# Patient Record
Sex: Male | Born: 1975
Health system: Southern US, Community
[De-identification: ages and names within clinical notes are randomized; demographics above are authoritative.]

## PROBLEM LIST (undated history)

## (undated) DIAGNOSIS — T7840XA Allergy, unspecified, initial encounter: Secondary | ICD-10-CM

## (undated) HISTORY — PX: WISDOM TOOTH EXTRACTION: SHX21

## (undated) HISTORY — DX: Allergy, unspecified, initial encounter: T78.40XA

## (undated) HISTORY — PX: VASECTOMY REVERSAL: SHX243

## (undated) HISTORY — PX: DEBRIDEMENT PREPATELLAR BURSA: SHX1441

## (undated) HISTORY — PX: VASECTOMY: SHX75

---

## 2012-02-14 ENCOUNTER — Ambulatory Visit (INDEPENDENT_AMBULATORY_CARE_PROVIDER_SITE_OTHER): Payer: BC Managed Care – PPO | Admitting: Family Medicine

## 2012-02-14 VITALS — BP 108/74 | HR 86 | Temp 98.1°F | Resp 16 | Ht 71.0 in | Wt 187.4 lb

## 2012-02-14 DIAGNOSIS — Z0000 Encounter for general adult medical examination without abnormal findings: Secondary | ICD-10-CM

## 2012-02-14 DIAGNOSIS — J069 Acute upper respiratory infection, unspecified: Secondary | ICD-10-CM

## 2012-02-14 LAB — LIPID PANEL
Cholesterol: 176 mg/dL (ref 0–200)
Triglycerides: 56 mg/dL (ref <150)
VLDL: 11 mg/dL (ref 0–40)

## 2012-02-14 LAB — COMPREHENSIVE METABOLIC PANEL
ALT: 17 U/L (ref 0–53)
CO2: 29 mEq/L (ref 19–32)
Calcium: 9.9 mg/dL (ref 8.4–10.5)
Chloride: 105 mEq/L (ref 96–112)
Glucose, Bld: 84 mg/dL (ref 70–99)
Sodium: 143 mEq/L (ref 135–145)
Total Bilirubin: 0.6 mg/dL (ref 0.3–1.2)
Total Protein: 7 g/dL (ref 6.0–8.3)

## 2012-02-14 NOTE — Progress Notes (Signed)
  Subjective:    Patient ID: Omar Beard, male    DOB: 02-19-1976, 36 y.o.   MRN: [id number]  HPI Patient presents for CPE.  Paperwork required by work for [employer].  SH/ Wife to be induced tonight; this will be patients sixth child.  Review of Systems See blue form(to be scanned)    Objective:   Physical Exam  Constitutional: He appears well-developed and well-nourished.  HENT:  Head: Normocephalic and atraumatic.  Right Ear: External ear normal.  Left Ear: External ear normal.  Nose: Rhinorrhea present.  Mouth/Throat: Posterior oropharyngeal edema: clear post nasal drainage.  Eyes: Conjunctivae and EOM are normal. Pupils are equal, round, and reactive to light.  Neck: Normal range of motion. Neck supple.  Cardiovascular: Normal rate, regular rhythm and normal heart sounds.   Pulmonary/Chest: Effort normal and breath sounds normal.  Abdominal: Soft. Bowel sounds are normal. He exhibits no mass. There is no tenderness.  Musculoskeletal: Normal range of motion.  Neurological: He is alert. He has normal reflexes.  Skin: Skin is warm.  Psychiatric: He has a normal mood and affect.          Assessment & Plan:   1. Routine general medical examination at a health care facility  Comprehensive metabolic panel, Lipid panel; form completion  2. URI (upper respiratory infection)  Symptomatic care

## 2012-02-17 NOTE — Progress Notes (Signed)
Quick Note:  Patient has normal labs except for a slightly elevated potassium(as it does not appear to be a hemolyzed specimen). This should be repeated in the next week. Lab only encounter. Please call patient and send him a copy of his labs. He also had a job PE form that I will fax today. Thanks! KR  ______

## 2012-02-27 ENCOUNTER — Ambulatory Visit (INDEPENDENT_AMBULATORY_CARE_PROVIDER_SITE_OTHER): Payer: BC Managed Care – PPO | Admitting: Internal Medicine

## 2012-02-27 VITALS — BP 110/72 | HR 61 | Temp 97.9°F | Resp 16 | Ht 71.0 in | Wt 186.0 lb

## 2012-02-27 DIAGNOSIS — E875 Hyperkalemia: Secondary | ICD-10-CM | POA: Insufficient documentation

## 2012-02-27 DIAGNOSIS — Z719 Counseling, unspecified: Secondary | ICD-10-CM

## 2012-02-27 LAB — BASIC METABOLIC PANEL
BUN: 12 mg/dL (ref 6–23)
CO2: 27 mEq/L (ref 19–32)
Chloride: 105 mEq/L (ref 96–112)
Creat: 1.03 mg/dL (ref 0.50–1.35)
Glucose, Bld: 85 mg/dL (ref 70–99)
Potassium: 5.3 mEq/L (ref 3.5–5.3)

## 2012-02-27 NOTE — Patient Instructions (Signed)
Hyperkalemia   Hyperkalemia is when you have too much potassium in your blood. This can be a life-threatening condition. Potassium is normally removed (excreted) from the body by the kidneys.   CAUSES   The potassium level in your body can become too high for the following reasons:   You take in too much potassium. You can do this by:   Using salt substitutes. They contain large amounts of potassium.   Taking potassium supplements from your caregiver. The dose may be too high for you.   Eating foods or taking nutritional products with potassium.   You excrete too little potassium. This can happen if:   Your kidneys are not functioning properly. Kidney (renal) disease is a very common cause of hyperkalemia.   You are taking medicines that lower your excretion of potassium, such as certain diuretic medicines.   You have an adrenal gland disease called Addison's disease.   You have a urinary tract obstruction, such as kidney stones.   You are on treatment to mechanically clean your blood (dialysis) and you skip a treatment.   You release a high amount of potassium from your cells into your blood. You may have a condition that causes potassium to move from your cells to your bloodstream. This can happen with:   Injury to muscles or other tissues. Most potassium is stored in the muscles.   Severe burns or infections.   Acidic blood plasma (acidosis). Acidosis can result from many diseases, such as uncontrolled diabetes.   SYMPTOMS   Usually, there are no symptoms unless the potassium is dangerously high or has risen very quickly. Symptoms may include:   Irregular or very slow heartbeat.   Feeling sick to your stomach (nauseous).   Tiredness (fatigue).   Nerve problems such as tingling of the skin, numbness of the hands or feet, weakness, or paralysis.   DIAGNOSIS   A simple blood test can measure the amount of potassium in your body. An electrocardiogram test of the heart can also help make the diagnosis. The heart may  beat dangerously fast or slow down and stop beating with severe hyperkalemia.   TREATMENT   Treatment depends on how bad the condition is and on the underlying cause.   If the hyperkalemia is an emergency (causing heart problems or paralysis), many different medicines can be used alone or together to lower the potassium level briefly. This may include an insulin injection even if you are not diabetic. Emergency dialysis may be needed to remove potassium from the body.   If the hyperkalemia is less severe or dangerous, the underlying cause is treated. This can include taking medicines if needed. Your prescription medicines may be changed. You may also need to take a medicine to help your body get rid of potassium. You may need to eat a diet low in potassium.   HOME CARE INSTRUCTIONS   Take medicines and supplements as directed by your caregiver.   Do not take any over-the-counter medicines, supplements, natural products, herbs, or vitamins without reviewing them with your caregiver. Certain supplements and natural food products can have high amounts of potassium. Other products (such as ibuprofen) can damage weak kidneys and raise your potassium.   You may be asked to do repeat lab tests. Be sure to follow these directions.   If you have kidney disease, you may need to follow a low potassium diet.   SEEK MEDICAL CARE IF:   You notice an irregular or very slow heartbeat.     You feel lightheaded.   You develop weakness that is unusual for you.   SEEK IMMEDIATE MEDICAL CARE IF:   You have shortness of breath.   You have chest discomfort.   You pass out (faint).   MAKE SURE YOU:   Understand these instructions.   Will watch your condition.   Will get help right away if you are not doing well or get worse.   Document Released: 11/29/2002 Document Revised: 11/28/2011 Document Reviewed: 05/16/2011   Exit[employer]® Patient Information ©2012 [employer].

## 2012-02-27 NOTE — Progress Notes (Signed)
  Subjective:    Patient ID: Omar Beard, male    DOB: 06/17/1976, 36 y.o.   MRN: [id number]  HPI Healthy   Review of Systems    Normal Objective:   Physical Exam  Normal      Assessment & Plan:  Hyperkalemia H   Hyperkalemia  Bmet Feels great.

## 2012-03-02 ENCOUNTER — Encounter: Payer: Self-pay | Admitting: *Deleted

## 2017-12-19 ENCOUNTER — Other Ambulatory Visit: Payer: Self-pay | Admitting: Family Medicine

## 2017-12-19 DIAGNOSIS — J3489 Other specified disorders of nose and nasal sinuses: Secondary | ICD-10-CM | POA: Diagnosis not present

## 2017-12-22 ENCOUNTER — Ambulatory Visit
Admission: RE | Admit: 2017-12-22 | Discharge: 2017-12-22 | Disposition: A | Discharge status: Home / Self Care | Payer: BLUE CROSS/BLUE SHIELD | Source: Ambulatory Visit | Attending: Family Medicine | Admitting: Family Medicine

## 2017-12-22 DIAGNOSIS — J3489 Other specified disorders of nose and nasal sinuses: Secondary | ICD-10-CM | POA: Diagnosis not present

## 2018-03-31 LAB — BASIC METABOLIC PANEL: Glucose: 92

## 2018-03-31 LAB — LIPID PANEL
Cholesterol: 149 (ref 0–200)
HDL: 62 (ref 35–70)
LDL CALC: 76
Triglycerides: 55 (ref 40–160)

## 2018-04-14 DIAGNOSIS — J342 Deviated nasal septum: Secondary | ICD-10-CM | POA: Diagnosis not present

## 2018-04-14 DIAGNOSIS — J329 Chronic sinusitis, unspecified: Secondary | ICD-10-CM | POA: Insufficient documentation

## 2018-04-14 DIAGNOSIS — J343 Hypertrophy of nasal turbinates: Secondary | ICD-10-CM | POA: Diagnosis not present

## 2018-11-23 NOTE — Progress Notes (Signed)
Subjective:    Patient ID: Omar Beard, male    DOB: 24-Oct-1976, 42 y.o.   MRN: [id number]  HPI: Omar Beard is here to establish as a new pt. He is a pleasant 42 male. PMH: Bil knee pain r/t repetitive use Recurrent sinusitis  He denies acute complaints currently  He estimates to drink >gallon water/day He follows heart healthy diet He exercises 5 days week- HITT cardio and weight training He abstains from tobacco/vape/ETOH use He reports sound sleep He is married with 9 children and his wife is pregnant with their 10th  Patient Care Team    Relationship Specialty Notifications Start End  [place], Orpha Bur D, NP PCP - General Family Medicine  11/26/18   Flo Shanks, MD Consulting Physician Otolaryngology  11/26/18     Patient Active Problem List   Diagnosis Date Noted  . Healthcare maintenance 11/26/2018  . Hyperkalemia 02/27/2012     History reviewed. No pertinent past medical history.   Past Surgical History:  Procedure Laterality Date  . DEBRIDEMENT PREPATELLAR BURSA    . VASECTOMY    . VASECTOMY REVERSAL    . WISDOM TOOTH EXTRACTION       History reviewed. No pertinent family history.   Social History   Substance and Sexual Activity  Drug Use Never     Social History   Substance and Sexual Activity  Alcohol Use Not Currently  . Frequency: Never     Social History   Tobacco Use  Smoking Status Never Smoker  Smokeless Tobacco Never Used     Outpatient Encounter Medications as of 11/26/2018  Medication Sig  . Black Elderberry 50 MG/5ML SYRP Take 15 mLs by mouth daily.  . Multiple Vitamins-Minerals (MULTIVITAMIN WITH MINERALS) tablet Take 1 tablet by mouth daily.  . SUPER B COMPLEX/C PO Take 1 tablet by mouth daily.   No facility-administered encounter medications on file as of 11/26/2018.     Allergies: Patient has no known allergies.  Body mass index is 27.49 kg/m.  Blood pressure 122/70, pulse 79, temperature 98.5 F (36.9 C),  temperature source Oral, height 5\' 11"  (1.803 m), weight 197 lb 1.6 oz (89.4 kg), SpO2 98 %.  Review of Systems  Constitutional: Positive for fatigue. Negative for activity change, appetite change, chills, diaphoresis, fever and unexpected weight change.  HENT: Negative for congestion.   Eyes: Negative for visual disturbance.  Respiratory: Negative for cough, chest tightness, shortness of breath, wheezing and stridor.   Cardiovascular: Negative for chest pain, palpitations and leg swelling.  Gastrointestinal: Negative for abdominal distention, abdominal pain, anal bleeding, blood in stool, constipation, diarrhea, nausea, rectal pain and vomiting.  Endocrine: Negative for cold intolerance, heat intolerance, polydipsia, polyphagia and polyuria.  Genitourinary: Negative for difficulty urinating and flank pain.  Musculoskeletal: Positive for arthralgias and myalgias. Negative for back pain, gait problem, joint swelling, neck pain and neck stiffness.  Skin: Negative for color change, pallor, rash and wound.  Neurological: Negative for dizziness and headaches.  Hematological: Does not bruise/bleed easily.  Psychiatric/Behavioral: Negative for agitation, behavioral problems, confusion, decreased concentration, dysphoric mood, hallucinations, self-injury, sleep disturbance and suicidal ideas. The patient is not nervous/anxious and is not hyperactive.        Objective:   Physical Exam  Constitutional: He is oriented to person, place, and time. He appears well-developed and well-nourished. No distress.  HENT:  Head: Normocephalic and atraumatic.  Right Ear: External ear normal.  Left Ear: External ear normal.  Nose: Nose normal.  Mouth/Throat: Oropharynx  is clear and moist.  Eyes: Pupils are equal, round, and reactive to light. Conjunctivae and EOM are normal.  Cardiovascular: Normal rate, regular rhythm, normal heart sounds and intact distal pulses.  No murmur heard. Pulmonary/Chest: Effort  normal and breath sounds normal. No stridor. No respiratory distress. He has no wheezes. He has no rales. He exhibits no tenderness.  Neurological: He is alert and oriented to person, place, and time.  Skin: Skin is warm and dry. Capillary refill takes less than 2 seconds. He is not diaphoretic. No erythema. No pallor.  Psychiatric: He has a normal mood and affect. His behavior is normal. Judgment and thought content normal.  Nursing note and vitals reviewed.     Assessment & Plan:   1. Healthcare maintenance     Healthcare maintenance Overall you are doing a great job taking care of yourself! Continue to drink plenty of water and follow Mediterranean diet. Continue regular exercise. Recommend OTC Glucosamine and OTC Tumeric for joint/connective tissue health. Recommend complete physical in 5 months, please schedule fasting labs the week prior.    FOLLOW-UP:  Return in about 5 months (around 04/27/2019) for CPE, Fasting Labs.

## 2018-11-26 ENCOUNTER — Encounter: Payer: Self-pay | Admitting: Adult Health

## 2018-11-26 ENCOUNTER — Ambulatory Visit (INDEPENDENT_AMBULATORY_CARE_PROVIDER_SITE_OTHER): Payer: BLUE CROSS/BLUE SHIELD | Admitting: Adult Health

## 2018-11-26 VITALS — BP 122/70 | HR 79 | Temp 98.5°F | Ht 71.0 in | Wt 197.1 lb

## 2018-11-26 DIAGNOSIS — Z0000 Encounter for general adult medical examination without abnormal findings: Secondary | ICD-10-CM | POA: Insufficient documentation

## 2018-11-26 NOTE — Assessment & Plan Note (Signed)
Overall you are doing a great job taking care of yourself! Continue to drink plenty of water and follow Mediterranean diet. Continue regular exercise. Recommend OTC Glucosamine and OTC Tumeric for joint/connective tissue health. Recommend complete physical in 5 months, please schedule fasting labs the week prior.

## 2018-11-26 NOTE — Patient Instructions (Signed)
Mediterranean Diet A Mediterranean diet refers to food and lifestyle choices that are based on the traditions of countries located on the [place] Sea. This way of eating has been shown to help prevent certain conditions and improve outcomes for people who have chronic diseases, like kidney disease and heart disease. What are tips for following this plan? Lifestyle  Cook and eat meals together with your family, when possible.  Drink enough fluid to keep your urine clear or pale yellow.  Be physically active every day. This includes: ? Aerobic exercise like running or swimming. ? Leisure activities like gardening, walking, or housework.  Get 7-8 hours of sleep each night.  If recommended by your health care provider, drink red wine in moderation. This means 1 glass a day for nonpregnant women and 2 glasses a day for men. A glass of wine equals 5 oz (150 mL). Reading food labels  Check the serving size of packaged foods. For foods such as rice and pasta, the serving size refers to the amount of cooked product, not dry.  Check the total fat in packaged foods. Avoid foods that have saturated fat or trans fats.  Check the ingredients list for added sugars, such as corn syrup. Shopping  At the grocery store, buy most of your food from the areas near the walls of the store. This includes: ? Fresh fruits and vegetables (produce). ? Grains, beans, nuts, and seeds. Some of these may be available in unpackaged forms or large amounts (in bulk). ? Fresh seafood. ? Poultry and eggs. ? Low-fat dairy products.  Buy whole ingredients instead of prepackaged foods.  Buy fresh fruits and vegetables in-season from local farmers markets.  Buy frozen fruits and vegetables in resealable bags.  If you do not have access to quality fresh seafood, buy precooked frozen shrimp or canned fish, such as tuna, salmon, or sardines.  Buy small amounts of raw or cooked vegetables, salads, or olives from the  deli or salad bar at your store.  Stock your pantry so you always have certain foods on hand, such as olive oil, canned tuna, canned tomatoes, rice, pasta, and beans. Cooking  Cook foods with extra-virgin olive oil instead of using butter or other vegetable oils.  Have meat as a side dish, and have vegetables or grains as your main dish. This means having meat in small portions or adding small amounts of meat to foods like pasta or stew.  Use beans or vegetables instead of meat in common dishes like chili or lasagna.  Experiment with different cooking methods. Try roasting or broiling vegetables instead of steaming or sauteing them.  Add frozen vegetables to soups, stews, pasta, or rice.  Add nuts or seeds for added healthy fat at each meal. You can add these to yogurt, salads, or vegetable dishes.  Marinate fish or vegetables using olive oil, lemon juice, garlic, and fresh herbs. Meal planning  Plan to eat 1 vegetarian meal one day each week. Try to work up to 2 vegetarian meals, if possible.  Eat seafood 2 or more times a week.  Have healthy snacks readily available, such as: ? Vegetable sticks with hummus. ? Greek yogurt. ? Fruit and nut trail mix.  Eat balanced meals throughout the week. This includes: ? Fruit: 2-3 servings a day ? Vegetables: 4-5 servings a day ? Low-fat dairy: 2 servings a day ? Fish, poultry, or lean meat: 1 serving a day ? Beans and legumes: 2 or more servings a week ? Nuts   and seeds: 1-2 servings a day ? Whole grains: 6-8 servings a day ? Extra-virgin olive oil: 3-4 servings a day  Limit red meat and sweets to only a few servings a month What are my food choices?  Mediterranean diet ? Recommended ? Grains: Whole-grain pasta. Brown rice. Bulgar wheat. Polenta. Couscous. Whole-wheat bread. [place] Cobbatmeal. Quinoa. ? Vegetables: Artichokes. Beets. Broccoli. Cabbage. Carrots. Eggplant. Green beans. Chard. Kale. Spinach. Onions. Leeks. Peas. Squash.  Tomatoes. Peppers. Radishes. ? Fruits: Apples. Apricots. Avocado. Berries. Bananas. Cherries. Dates. Figs. Grapes. Lemons. Melon. Oranges. Peaches. Plums. Pomegranate. ? Meats and other protein foods: Beans. Almonds. Sunflower seeds. Pine nuts. Peanuts. Cod. Salmon. Scallops. Shrimp. Tuna. Tilapia. Clams. Oysters. Eggs. ? Dairy: Low-fat milk. Cheese. Greek yogurt. ? Beverages: Water. Red wine. Herbal tea. ? Fats and oils: Extra virgin olive oil. Avocado oil. Grape seed oil. ? Sweets and desserts: [place] yogurt with honey. Baked apples. Poached pears. Trail mix. ? Seasoning and other foods: Basil. Cilantro. Coriander. Cumin. Mint. Parsley. Sage. Rosemary. Tarragon. Garlic. Oregano. Thyme. Pepper. Balsalmic vinegar. Tahini. Hummus. Tomato sauce. Olives. Mushrooms. ? Limit these ? Grains: Prepackaged pasta or rice dishes. Prepackaged cereal with added sugar. ? Vegetables: Deep fried potatoes (french fries). ? Fruits: Fruit canned in syrup. ? Meats and other protein foods: Beef. Pork. Lamb. Poultry with skin. Hot dogs. Tomasa BlaseBacon. ? Dairy: Ice cream. Sour cream. Whole milk. ? Beverages: Juice. Sugar-sweetened soft drinks. Beer. Liquor and spirits. ? Fats and oils: Butter. Canola oil. Vegetable oil. Beef fat (tallow). Lard. ? Sweets and desserts: Cookies. Cakes. Pies. Candy. ? Seasoning and other foods: Mayonnaise. Premade sauces and marinades. ? The items listed may not be a complete list. Talk with your dietitian about what dietary choices are right for you. Summary  The Mediterranean diet includes both food and lifestyle choices.  Eat a variety of fresh fruits and vegetables, beans, nuts, seeds, and whole grains.  Limit the amount of red meat and sweets that you eat.  Talk with your health care provider about whether it is safe for you to drink red wine in moderation. This means 1 glass a day for nonpregnant women and 2 glasses a day for men. A glass of wine equals 5 oz (150 mL). This information  is not intended to replace advice given to you by your health care provider. Make sure you discuss any questions you have with your health care provider. Document Released: 08/01/2016 Document Revised: 09/03/2016 Document Reviewed: 08/01/2016 Elsevier Interactive Patient Education  [employer]2018 Else[employer] Inc.  Overall you are doing a great job taking care of yourself! Continue to drink plenty of water and follow Mediterranean diet. Continue regular exercise. Recommend OTC Glucosamine and OTC Tumeric for joint/connective tissue health. Recommend complete physical in 5 months, please schedule fasting labs the week prior. WELCOME TO THE PRACTICE!

## 2019-02-04 IMAGING — CT CT MAXILLOFACIAL W/O CM
2 of 3 series · 15 of 37 positions shown, 18 images · non-contrast
Comparison: None.

CLINICAL DATA: Chronic sinus region pain with nasal discharge

EXAM:
CT MAXILLOFACIAL WITHOUT CONTRAST
TECHNIQUE: Multidetector CT imaging of the maxillofacial structures was
performed. Multiplanar CT image reconstructions were also generated.

[Series 601: coronal facial · coronal · 0.39mm/px · 3 of 91 slices shown]
[im 31/91  bone]
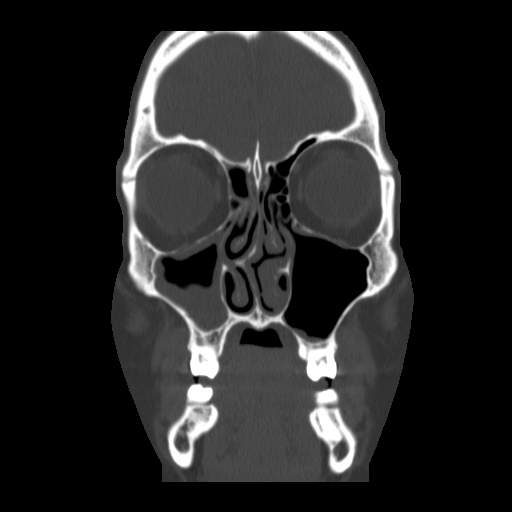
[im 46/91  bone]
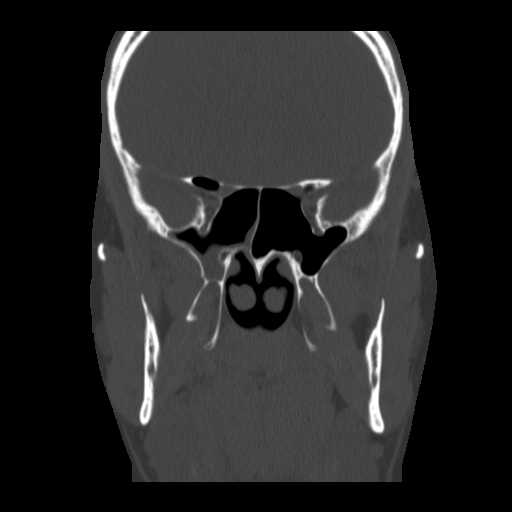
[im 61/91  bone]
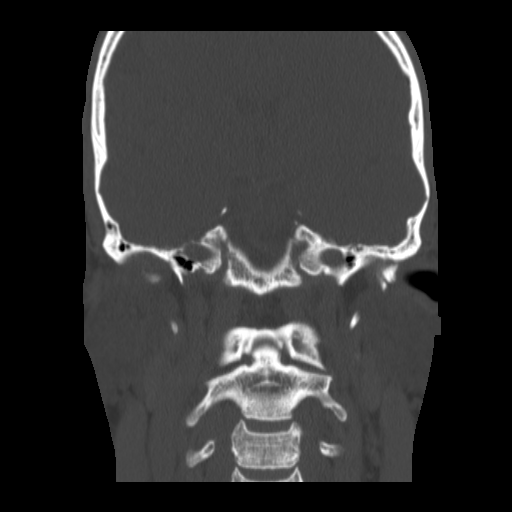

[Series 602: sagittal facial · sagittal · 0.39mm/px · 12 of 87 slices shown, 15 images]
[im 6/87  brain]
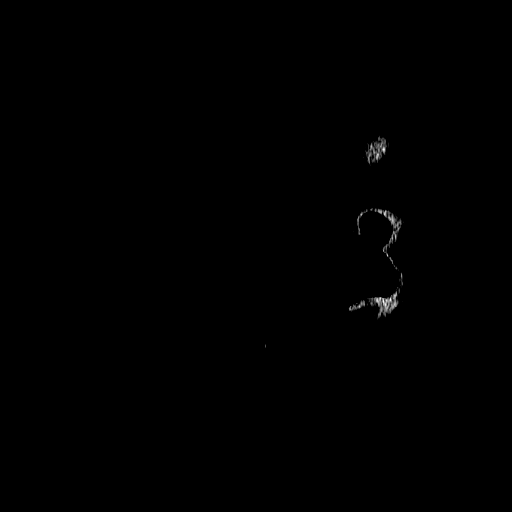
[im 6/87  bone]
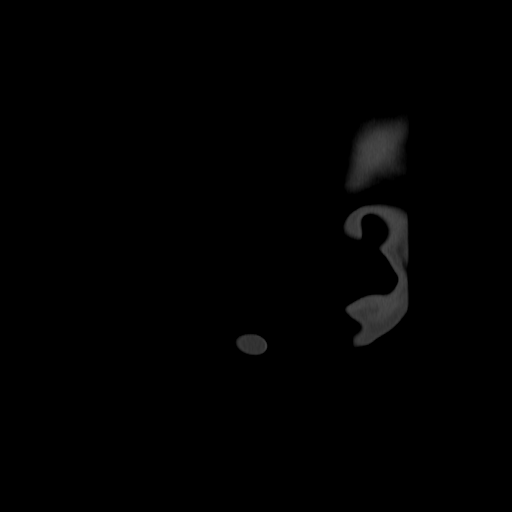
[im 12/87  bone]
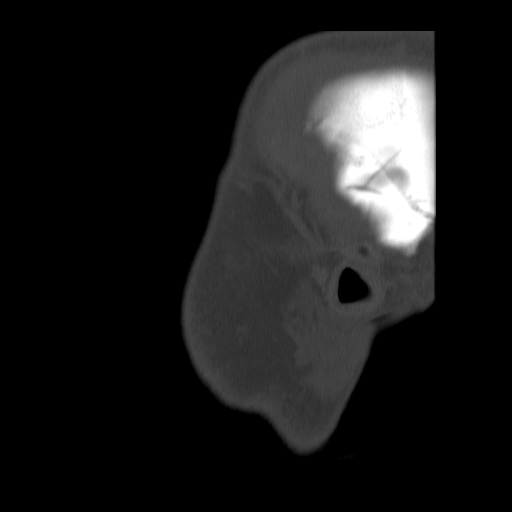
[im 18/87  bone]
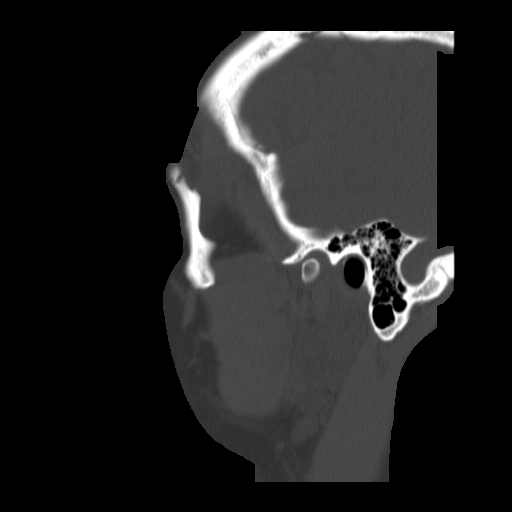
[im 29/87  bone]
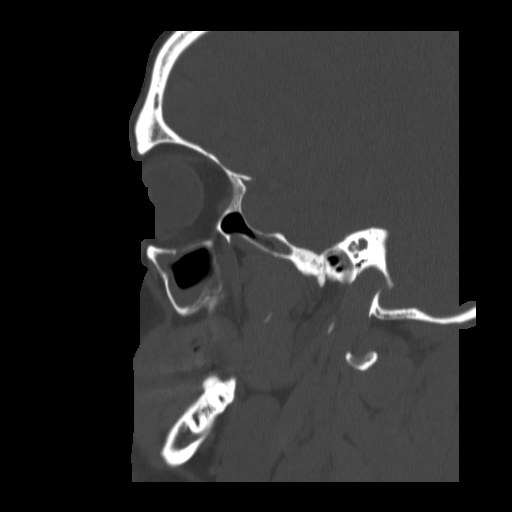
[im 35/87  brain]
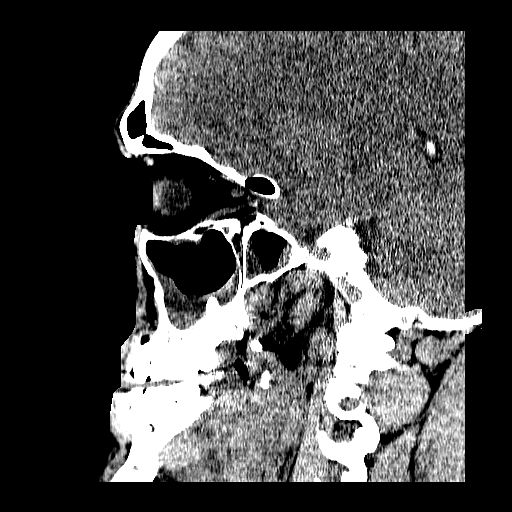
[im 35/87  bone]
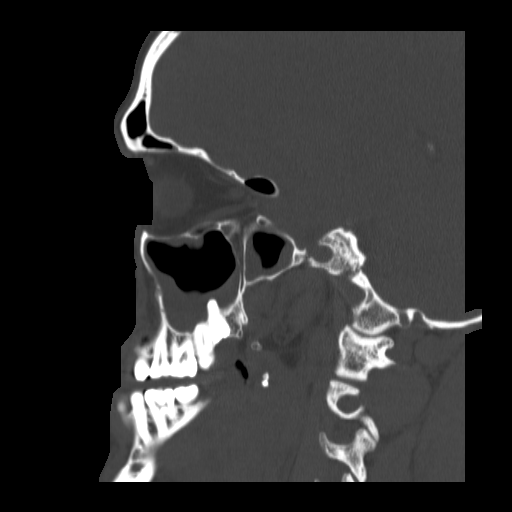
[im 41/87  bone]
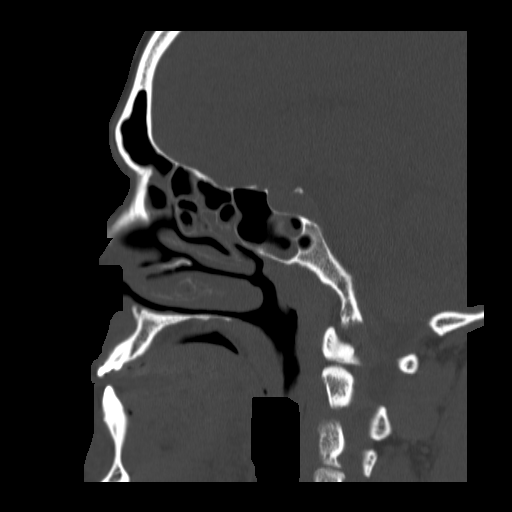
[im 46/87  bone]
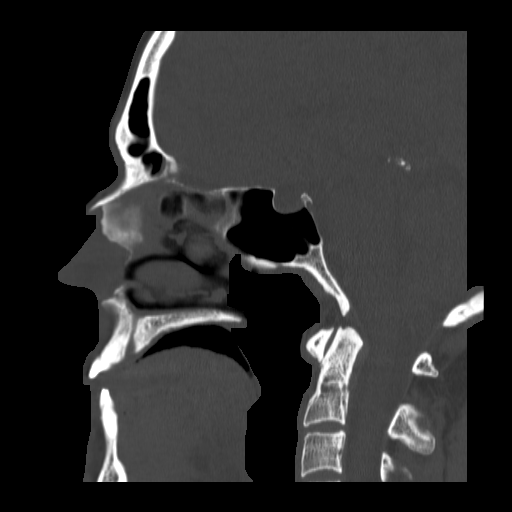
[im 52/87  bone]
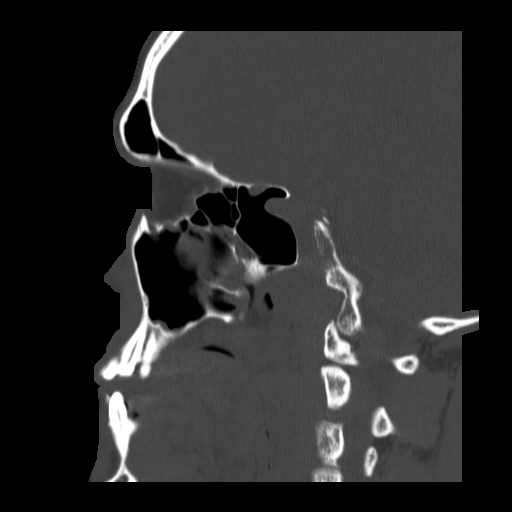
[im 58/87  brain]
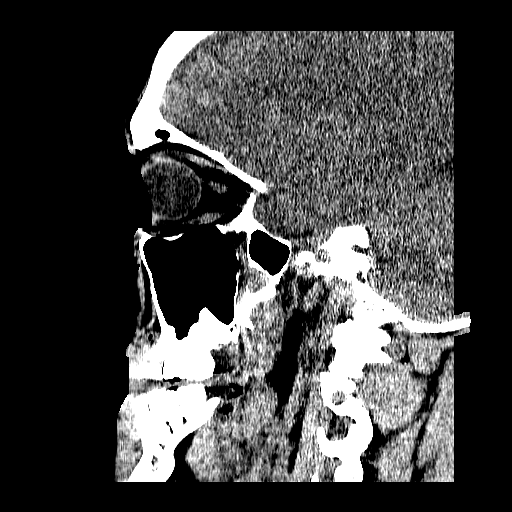
[im 58/87  bone]
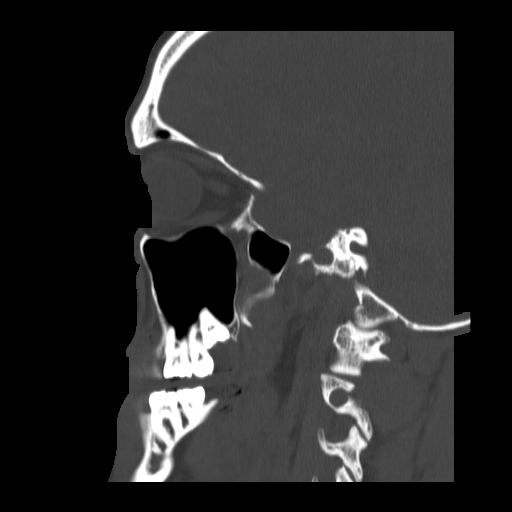
[im 69/87  bone]
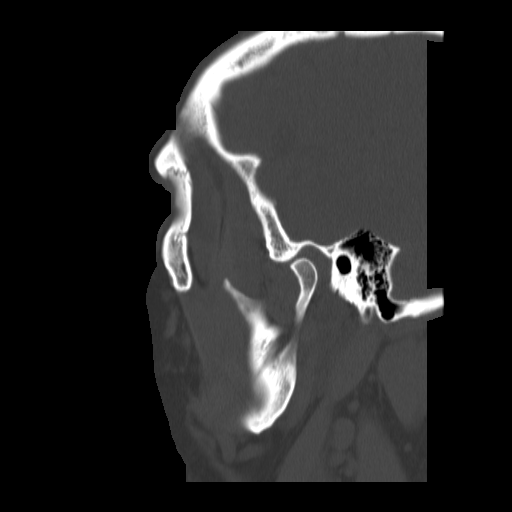
[im 75/87  bone]
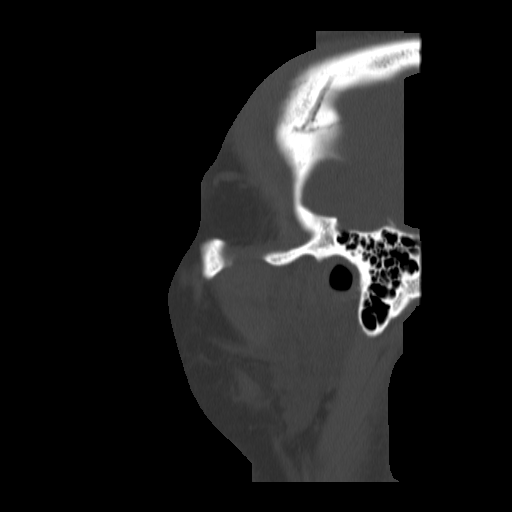
[im 81/87  bone]
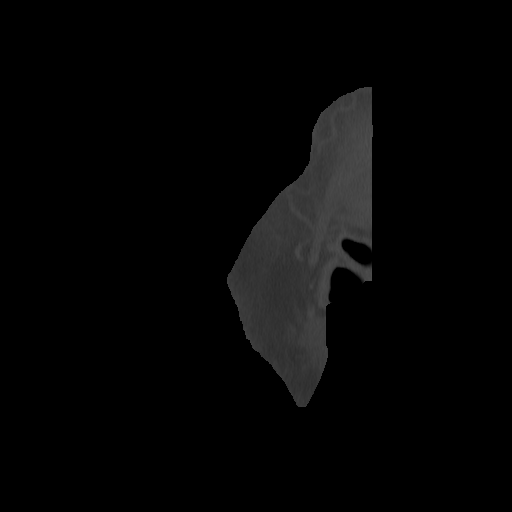

[15 of 37 positions shown; findings below may reference images not displayed]

FINDINGS: Osseous: No fracture or dislocation. No evident blastic or lytic
bone lesion. No bony destruction or expansion evident.

Orbits: Orbits appear symmetric bilaterally. There are no
intraorbital lesions apparent.

Sinuses: There is moderate mucosal thickening throughout the right
maxillary antrum. There is moderate mucosal thickening throughout
the right sphenoid sinus with slight mucosal thickening just the
midline in the left sphenoid sinus region. There is opacification of
multiple ethmoid air cells bilaterally. Frontal sinuses bilaterally
are clear. No air-fluid levels are noted. There is edema at the
infundibulum of the right ostiomeatal unit complex with obstruction
of the right ostiomeatal unit complex. The left ostiomeatal unit
complex is patent.

There is mucosal thickening along the lateral aspect of the left
nasal cavity without nares obstruction. There is rightward deviation
of the nasal septum.

Soft tissues: Visualized salivary glands appear symmetric
bilaterally. No adenopathy. Visualized tongue and tongue base
regions appear unremarkable. Visualized pharynx and cervical spine
appear unremarkable.

Limited intracranial: Mastoid air cells are clear. Visualized
intracranial regions appear normal.
IMPRESSION: 1. Multifocal paranasal sinus disease with opacification in multiple
ethmoid air cells bilaterally as well as moderate mucosal thickening
throughout the right sphenoid and frontal sinuses. There is edema in
infundibulum of the right ostiomeatal unit complex with obstruction
of the right ostiomeatal unit complex. The left ostiomeatal unit
complex is patent. There is no left-sided paranasal sinus disease
beyond opacification of multiple ethmoid air cells on the left as
well as minimal mucosal thickening along the medial aspect of the
left sphenoid sinus.

2. No bony destruction or expansion. No blastic or lytic bone
lesions.

3.  Rightward deviation of the nasal septum.  No nares obstruction.

4.  No intraorbital lesions.

5.  Study otherwise unremarkable.

## 2019-04-25 DIAGNOSIS — H16041 Marginal corneal ulcer, right eye: Secondary | ICD-10-CM | POA: Diagnosis not present

## 2019-04-25 DIAGNOSIS — H01022 Squamous blepharitis right lower eyelid: Secondary | ICD-10-CM | POA: Diagnosis not present

## 2019-04-30 ENCOUNTER — Other Ambulatory Visit: Payer: BLUE CROSS/BLUE SHIELD

## 2019-05-03 DIAGNOSIS — H01022 Squamous blepharitis right lower eyelid: Secondary | ICD-10-CM | POA: Diagnosis not present

## 2019-05-03 DIAGNOSIS — H16041 Marginal corneal ulcer, right eye: Secondary | ICD-10-CM | POA: Diagnosis not present

## 2019-05-05 ENCOUNTER — Encounter: Payer: BLUE CROSS/BLUE SHIELD | Admitting: Adult Health

## 2019-05-05 ENCOUNTER — Encounter: Payer: BLUE CROSS/BLUE SHIELD | Admitting: Family Medicine

## 2019-07-29 ENCOUNTER — Other Ambulatory Visit: Payer: Self-pay

## 2019-07-29 ENCOUNTER — Other Ambulatory Visit: Payer: BLUE CROSS/BLUE SHIELD

## 2019-07-29 DIAGNOSIS — Z0000 Encounter for general adult medical examination without abnormal findings: Secondary | ICD-10-CM

## 2019-07-29 DIAGNOSIS — E875 Hyperkalemia: Secondary | ICD-10-CM

## 2019-07-29 DIAGNOSIS — R7989 Other specified abnormal findings of blood chemistry: Secondary | ICD-10-CM | POA: Diagnosis not present

## 2019-07-30 LAB — CBC WITH DIFFERENTIAL/PLATELET
Basophils Absolute: 0.1 10*3/uL (ref 0.0–0.2)
Basos: 2 %
EOS (ABSOLUTE): 0.2 10*3/uL (ref 0.0–0.4)
Eos: 5 %
Hematocrit: 42.1 % (ref 37.5–51.0)
Hemoglobin: 13.8 g/dL (ref 13.0–17.7)
Immature Grans (Abs): 0 10*3/uL (ref 0.0–0.1)
Immature Granulocytes: 0 %
Lymphocytes Absolute: 1.6 10*3/uL (ref 0.7–3.1)
Lymphs: 32 %
MCH: 28 pg (ref 26.6–33.0)
MCHC: 32.8 g/dL (ref 31.5–35.7)
MCV: 85 fL (ref 79–97)
Monocytes Absolute: 0.5 10*3/uL (ref 0.1–0.9)
Monocytes: 9 %
Neutrophils Absolute: 2.6 10*3/uL (ref 1.4–7.0)
Neutrophils: 52 %
Platelets: 318 10*3/uL (ref 150–450)
RBC: 4.93 x10E6/uL (ref 4.14–5.80)
RDW: 12.8 % (ref 11.6–15.4)
WBC: 5 10*3/uL (ref 3.4–10.8)

## 2019-07-30 LAB — COMPREHENSIVE METABOLIC PANEL
ALT: 16 IU/L (ref 0–44)
AST: 21 IU/L (ref 0–40)
Albumin/Globulin Ratio: 2.2 (ref 1.2–2.2)
Albumin: 4.3 g/dL (ref 4.0–5.0)
Alkaline Phosphatase: 35 IU/L — ABNORMAL LOW (ref 39–117)
BUN/Creatinine Ratio: 8 — ABNORMAL LOW (ref 9–20)
BUN: 9 mg/dL (ref 6–24)
Bilirubin Total: 0.5 mg/dL (ref 0.0–1.2)
CO2: 23 mmol/L (ref 20–29)
Calcium: 8.9 mg/dL (ref 8.7–10.2)
Chloride: 97 mmol/L (ref 96–106)
Creatinine, Ser: 1.06 mg/dL (ref 0.76–1.27)
GFR calc Af Amer: 99 mL/min/{1.73_m2} (ref >59)
GFR calc non Af Amer: 86 mL/min/{1.73_m2} (ref >59)
Globulin, Total: 2 g/dL (ref 1.5–4.5)
Glucose: 88 mg/dL (ref 65–99)
Potassium: 4.3 mmol/L (ref 3.5–5.2)
Sodium: 135 mmol/L (ref 134–144)
Total Protein: 6.3 g/dL (ref 6.0–8.5)

## 2019-07-30 LAB — TSH: TSH: 1.24 u[IU]/mL (ref 0.450–4.500)

## 2019-07-30 LAB — LIPID PANEL
Chol/HDL Ratio: 3.1 ratio (ref 0.0–5.0)
Cholesterol, Total: 169 mg/dL (ref 100–199)
HDL: 54 mg/dL (ref >39)
LDL Calculated: 97 mg/dL (ref 0–99)
Triglycerides: 90 mg/dL (ref 0–149)
VLDL Cholesterol Cal: 18 mg/dL (ref 5–40)

## 2019-07-30 LAB — HEMOGLOBIN A1C
Est. average glucose Bld gHb Est-mCnc: 103 mg/dL
Hgb A1c MFr Bld: 5.2 % (ref 4.8–5.6)

## 2019-08-06 ENCOUNTER — Other Ambulatory Visit: Payer: BLUE CROSS/BLUE SHIELD

## 2019-08-09 NOTE — Progress Notes (Deleted)
   Subjective:    Patient ID: Omar Beard., male    DOB: June 09, 1976, 43 y.o.   MRN: [id number]  HPI: 11/27/2019 Omar Beard is here to establish as a new pt. He is a pleasant 12 male. PMH: Bil knee pain r/t repetitive use Recurrent sinusitis  He denies acute complaints currently  He estimates to drink >gallon water/day He follows heart healthy diet He exercises 5 days week- HITT cardio and weight training He abstains from tobacco/vape/ETOH use He reports sound sleep He is married with 9 children and his wife is pregnant with their 10th   08/11/2019 OV: Omar Beard is here for CPE  Healthcare Maintenance: Immunizations-  Patient Care Team    Relationship Specialty Notifications Start End  Mina Marble D, NP PCP - General Family Medicine  25/9/56   Jodi Marble, MD Consulting Physician Otolaryngology  11/26/18     Patient Active Problem List   Diagnosis Date Noted  . Healthcare maintenance 11/26/2018  . Hyperkalemia 02/27/2012     No past medical history on file.   Past Surgical History:  Procedure Laterality Date  . DEBRIDEMENT PREPATELLAR BURSA    . VASECTOMY    . VASECTOMY REVERSAL    . WISDOM TOOTH EXTRACTION       No family history on file.   Social History   Substance and Sexual Activity  Drug Use Never     Social History   Substance and Sexual Activity  Alcohol Use Not Currently  . Frequency: Never     Social History   Tobacco Use  Smoking Status Never Smoker  Smokeless Tobacco Never Used     Outpatient Encounter Medications as of 08/11/2019  Medication Sig  . Black Elderberry 50 MG/5ML SYRP Take 15 mLs by mouth daily.  . Multiple Vitamins-Minerals (MULTIVITAMIN WITH MINERALS) tablet Take 1 tablet by mouth daily.  . SUPER B COMPLEX/C PO Take 1 tablet by mouth daily.   No facility-administered encounter medications on file as of 08/11/2019.     Allergies: Patient has no known allergies.  There is no height or weight on file to  calculate BMI.  There were no vitals taken for this visit.     Review of Systems     Objective:   Physical Exam        Assessment & Plan:  No diagnosis found.  No problem-specific Assessment & Plan notes found for this encounter.    FOLLOW-UP:  No follow-ups on file.

## 2019-08-11 ENCOUNTER — Encounter: Payer: BLUE CROSS/BLUE SHIELD | Admitting: Adult Health

## 2019-08-23 NOTE — Progress Notes (Signed)
Subjective:    Patient ID: Omar Beard., male    DOB: Aug 19, 1976, 43 y.o.   MRN: [id number]  HPI:  11/27/2019 OV: [name] is here to establish as a new pt. He is a pleasant 89 male. PMH: Bil knee pain r/t repetitive use Recurrent sinusitis  He denies acute complaints currently  He estimates to drink >gallon water/day He follows heart healthy diet He exercises 5 days week- HITT cardio and weight training He abstains from tobacco/vape/ETOH use He reports sound sleep He is married with 9 children and his wife is pregnant with their 10th 08/24/2019 OV: [name] is here for CPE He continues to exercise 5- 6 days/week and follows a "clean diet" His wife delivered their 10th child "[place]" 6 months ago He continues to abstain from tobacco/vape/ETOH use  Reviewed 07/29/2019 Labs- TSH-WNL, 1.240  The 10-year ASCVD risk score Mikey Bussing DC Jr., et al., 2013) is: 1%  Values used to calculate the score:   Age: 43 years   Sex: Male   Is Non-Hispanic African American: No   Diabetic: No   Tobacco smoker: No   Systolic Blood Pressure: 413 mmHg   Is BP treated: No   HDL Cholesterol: 54 mg/dL   Total Cholesterol: 169 mg/dL  LDL- 97  A1c-WNL, 5.2  CMP-stable  CBC-stable  Healthcare Maintenance: Immunizations-UTD   Patient Care Team    Relationship Specialty Notifications Start End  Mina Marble D, NP PCP - General Family Medicine  24/4/01   Jodi Marble, MD Consulting Physician Otolaryngology  11/26/18     Patient Active Problem List   Diagnosis Date Noted  . Healthcare maintenance 11/26/2018  . Hyperkalemia 02/27/2012     History reviewed. No pertinent past medical history.   Past Surgical History:  Procedure Laterality Date  . DEBRIDEMENT PREPATELLAR BURSA    . VASECTOMY    . VASECTOMY REVERSAL    . WISDOM TOOTH EXTRACTION       History reviewed. No pertinent family history.   Social History   Substance and Sexual Activity  Drug Use  Never     Social History   Substance and Sexual Activity  Alcohol Use Not Currently  . Frequency: Never     Social History   Tobacco Use  Smoking Status Never Smoker  Smokeless Tobacco Never Used     Outpatient Encounter Medications as of 08/24/2019  Medication Sig  . Black Elderberry 50 MG/5ML SYRP Take 15 mLs by mouth daily.  . Multiple Vitamins-Minerals (MULTIVITAMIN WITH MINERALS) tablet Take 1 tablet by mouth daily.  . SUPER B COMPLEX/C PO Take 1 tablet by mouth daily.   No facility-administered encounter medications on file as of 08/24/2019.     Allergies: Patient has no known allergies.  Body mass index is 27.99 kg/m.  Blood pressure (!) 102/57, pulse (!) 59, temperature 98.1 F (36.7 C), temperature source Oral, height 5\' 11"  (1.803 m), weight 200 lb 11.2 oz (91 kg), SpO2 100 %.     Review of Systems  Constitutional: Positive for fatigue. Negative for activity change, appetite change, chills, diaphoresis, fever and unexpected weight change.  HENT: Negative for congestion.   Eyes: Negative for visual disturbance.  Respiratory: Negative for cough, chest tightness, shortness of breath, wheezing and stridor.   Cardiovascular: Negative for chest pain, palpitations and leg swelling.  Gastrointestinal: Negative for abdominal distention, anal bleeding, blood in stool, constipation, diarrhea, nausea and vomiting.  Endocrine: Negative for cold intolerance, heat intolerance, polydipsia, polyphagia and polyuria.  Genitourinary: Negative for difficulty urinating, flank pain and hematuria.  Musculoskeletal: Negative for arthralgias, back pain, gait problem, joint swelling, myalgias, neck pain and neck stiffness.  Skin: Negative for color change, pallor, rash and wound.  Neurological: Negative for dizziness and headaches.  Hematological: Negative for adenopathy. Does not bruise/bleed easily.  Psychiatric/Behavioral: Negative for agitation, behavioral problems, confusion,  decreased concentration, dysphoric mood, hallucinations, self-injury, sleep disturbance and suicidal ideas. The patient is not nervous/anxious and is not hyperactive.        Objective:   Physical Exam Vitals signs and nursing note reviewed.  Constitutional:      General: He is not in acute distress.    Appearance: Normal appearance. He is normal weight. He is not ill-appearing, toxic-appearing or diaphoretic.  HENT:     Head: Normocephalic and atraumatic.     Right Ear: Tympanic membrane, ear canal and external ear normal. There is no impacted cerumen.     Left Ear: Tympanic membrane, ear canal and external ear normal. There is no impacted cerumen.     Nose: Nose normal. No congestion.     Mouth/Throat:     Mouth: Mucous membranes are moist.     Pharynx: No oropharyngeal exudate.  Eyes:     Extraocular Movements: Extraocular movements intact.     Conjunctiva/sclera: Conjunctivae normal.     Pupils: Pupils are equal, round, and reactive to light.  Neck:     Musculoskeletal: Normal range of motion and neck supple.  Cardiovascular:     Rate and Rhythm: Normal rate and regular rhythm.     Pulses: Normal pulses.     Heart sounds: Normal heart sounds. No murmur. No friction rub. No gallop.   Pulmonary:     Effort: Pulmonary effort is normal. No respiratory distress.     Breath sounds: Normal breath sounds. No stridor. No wheezing or rales.  Chest:     Chest wall: No tenderness.  Abdominal:     General: Abdomen is flat. Bowel sounds are normal. There is no distension.     Palpations: Abdomen is soft. There is no mass.     Tenderness: There is no abdominal tenderness. There is no right CVA tenderness, left CVA tenderness, guarding or rebound.  Genitourinary:    Comments: Declined DRE/genital examination Musculoskeletal: Normal range of motion.  Skin:    General: Skin is warm and dry.     Capillary Refill: Capillary refill takes less than 2 seconds.  Neurological:     Mental  Status: He is alert and oriented to person, place, and time.  Psychiatric:        Mood and Affect: Mood normal.        Behavior: Behavior normal.        Thought Content: Thought content normal.        Judgment: Judgment normal.        Assessment & Plan:   1. Healthcare maintenance   2. Hyperkalemia     Healthcare maintenance Overall you are doing a great job taking care of yourself! Remain well hydrated, continue regular exercise and eating a healthy diet. Labs, blood pressure, and weight- all very good! Continue to social distance and wear a mask when in public! Please schedule complete physical in one year, fating labs the week prior. Recommend bi-weekly flowers for your wife.  Hyperkalemia 07/29/2019 K+ 4.3     FOLLOW-UP:  Return in about 1 year (around 08/23/2020) for CPE, Fasting Labs.

## 2019-08-24 ENCOUNTER — Encounter: Payer: Self-pay | Admitting: Adult Health

## 2019-08-24 ENCOUNTER — Other Ambulatory Visit: Payer: Self-pay

## 2019-08-24 ENCOUNTER — Ambulatory Visit (INDEPENDENT_AMBULATORY_CARE_PROVIDER_SITE_OTHER): Payer: BC Managed Care – PPO | Admitting: Adult Health

## 2019-08-24 DIAGNOSIS — E875 Hyperkalemia: Secondary | ICD-10-CM

## 2019-08-24 DIAGNOSIS — Z0000 Encounter for general adult medical examination without abnormal findings: Secondary | ICD-10-CM | POA: Diagnosis not present

## 2019-08-24 NOTE — Assessment & Plan Note (Signed)
Overall you are doing a great job taking care of yourself! Remain well hydrated, continue regular exercise and eating a healthy diet. Labs, blood pressure, and weight- all very good! Continue to social distance and wear a mask when in public! Please schedule complete physical in one year, fating labs the week prior. Recommend bi-weekly flowers for your wife.

## 2019-08-24 NOTE — Patient Instructions (Signed)

## 2019-08-24 NOTE — Assessment & Plan Note (Signed)
07/29/2019 K+ 4.3

## 2019-10-22 ENCOUNTER — Other Ambulatory Visit: Payer: Self-pay

## 2019-10-22 ENCOUNTER — Ambulatory Visit (INDEPENDENT_AMBULATORY_CARE_PROVIDER_SITE_OTHER): Payer: BC Managed Care – PPO

## 2019-10-22 VITALS — Temp 98.8°F

## 2019-10-22 DIAGNOSIS — Z23 Encounter for immunization: Secondary | ICD-10-CM | POA: Diagnosis not present

## 2019-10-22 NOTE — Progress Notes (Signed)
Pt here for influenza vaccine.  Screening questionnaire reviewed, VIS provided to patient, and any/all patient questions answered.  T. Nelson, CMA  

## 2020-02-21 ENCOUNTER — Ambulatory Visit: Payer: 59 | Attending: Internal Medicine

## 2020-02-21 DIAGNOSIS — Z20822 Contact with and (suspected) exposure to covid-19: Secondary | ICD-10-CM

## 2020-02-22 LAB — NOVEL CORONAVIRUS, NAA: SARS-CoV-2, NAA: DETECTED — AB

## 2020-12-12 ENCOUNTER — Other Ambulatory Visit: Payer: Self-pay

## 2020-12-12 ENCOUNTER — Ambulatory Visit: Payer: No Typology Code available for payment source

## 2020-12-27 ENCOUNTER — Ambulatory Visit: Payer: No Typology Code available for payment source | Admitting: Physician Assistant

## 2021-01-14 ENCOUNTER — Other Ambulatory Visit: Payer: Self-pay | Admitting: Physician Assistant

## 2021-01-14 DIAGNOSIS — Z0000 Encounter for general adult medical examination without abnormal findings: Secondary | ICD-10-CM

## 2021-01-19 ENCOUNTER — Other Ambulatory Visit: Payer: Self-pay

## 2021-01-19 ENCOUNTER — Other Ambulatory Visit: Payer: No Typology Code available for payment source

## 2021-01-19 DIAGNOSIS — Z0000 Encounter for general adult medical examination without abnormal findings: Secondary | ICD-10-CM

## 2021-01-20 LAB — LIPID PANEL
Chol/HDL Ratio: 3 ratio (ref 0.0–5.0)
Cholesterol, Total: 192 mg/dL (ref 100–199)
HDL: 63 mg/dL (ref >39)
LDL Chol Calc (NIH): 119 mg/dL — ABNORMAL HIGH (ref 0–99)
Triglycerides: 55 mg/dL (ref 0–149)
VLDL Cholesterol Cal: 10 mg/dL (ref 5–40)

## 2021-01-20 LAB — COMPREHENSIVE METABOLIC PANEL
ALT: 14 IU/L (ref 0–44)
AST: 21 IU/L (ref 0–40)
Albumin/Globulin Ratio: 2.4 — ABNORMAL HIGH (ref 1.2–2.2)
Albumin: 4.7 g/dL (ref 4.0–5.0)
Alkaline Phosphatase: 43 IU/L — ABNORMAL LOW (ref 44–121)
BUN/Creatinine Ratio: 8 — ABNORMAL LOW (ref 9–20)
BUN: 8 mg/dL (ref 6–24)
Bilirubin Total: 0.5 mg/dL (ref 0.0–1.2)
CO2: 25 mmol/L (ref 20–29)
Calcium: 9.7 mg/dL (ref 8.7–10.2)
Chloride: 102 mmol/L (ref 96–106)
Creatinine, Ser: 1.02 mg/dL (ref 0.76–1.27)
GFR calc Af Amer: 102 mL/min/{1.73_m2} (ref >59)
GFR calc non Af Amer: 88 mL/min/{1.73_m2} (ref >59)
Globulin, Total: 2 g/dL (ref 1.5–4.5)
Glucose: 90 mg/dL (ref 65–99)
Potassium: 4.6 mmol/L (ref 3.5–5.2)
Sodium: 140 mmol/L (ref 134–144)
Total Protein: 6.7 g/dL (ref 6.0–8.5)

## 2021-01-20 LAB — CBC
Hematocrit: 44 % (ref 37.5–51.0)
Hemoglobin: 14.7 g/dL (ref 13.0–17.7)
MCH: 28.2 pg (ref 26.6–33.0)
MCHC: 33.4 g/dL (ref 31.5–35.7)
MCV: 84 fL (ref 79–97)
Platelets: 319 10*3/uL (ref 150–450)
RBC: 5.22 x10E6/uL (ref 4.14–5.80)
RDW: 12.8 % (ref 11.6–15.4)
WBC: 5.1 10*3/uL (ref 3.4–10.8)

## 2021-01-20 LAB — HEMOGLOBIN A1C
Est. average glucose Bld gHb Est-mCnc: 103 mg/dL
Hgb A1c MFr Bld: 5.2 % (ref 4.8–5.6)

## 2021-01-20 LAB — TSH: TSH: 1.49 u[IU]/mL (ref 0.450–4.500)

## 2021-01-23 ENCOUNTER — Other Ambulatory Visit: Payer: Self-pay

## 2021-01-23 ENCOUNTER — Ambulatory Visit (INDEPENDENT_AMBULATORY_CARE_PROVIDER_SITE_OTHER): Payer: No Typology Code available for payment source | Admitting: Physician Assistant

## 2021-01-23 ENCOUNTER — Encounter: Payer: Self-pay | Admitting: Physician Assistant

## 2021-01-23 VITALS — BP 107/64 | HR 81 | Ht 71.0 in | Wt 205.3 lb

## 2021-01-23 DIAGNOSIS — Z1211 Encounter for screening for malignant neoplasm of colon: Secondary | ICD-10-CM | POA: Diagnosis not present

## 2021-01-23 DIAGNOSIS — Z0000 Encounter for general adult medical examination without abnormal findings: Secondary | ICD-10-CM

## 2021-01-23 DIAGNOSIS — Z23 Encounter for immunization: Secondary | ICD-10-CM | POA: Diagnosis not present

## 2021-01-23 NOTE — Patient Instructions (Signed)

## 2021-01-23 NOTE — Progress Notes (Signed)
Male physical   Impression and Recommendations:    1. Healthcare maintenance   2. Screening for colon cancer   3. Need for influenza vaccination      1) Anticipatory Guidance: Skin CA prevention- recommend to use sunscreen when outside along with skin surveillance; eating a balanced and modest diet; physical activity at least 25 minutes per day or minimum of 150 min/ week moderate to intense activity.  2) Immunizations / Screenings / Labs:   All immunizations are up-to-date per recommendations or will be updated today if pt allows.    - Patient understands with dental and vision screens they will schedule independently.  - Obtained CBC, CMP, HgA1c, Lipid panel, and TSH when fasting, if not already done past 12 mo/ recently.  Most labs are essentially within normal limits or stable from prior with the exception of lipid panel, mildly elevated LDL. -Agreeable to influenza and screening colonoscopy.  -UTD on Tdap. Has completed Covid vaccine series.  3) Weight:  Continue to improve diet habits to improve overall feelings of well being and objective health data. Improve nutrient density of diet through increasing intake of fruits and vegetables and decreasing saturated fats, white flour products and refined sugars.  4) Healthcare Maintenance: -Recommend to increase physical activity and continue heart healthy diet.  -If symptoms of food getting stuck in chest fails to improve or worsen recommend to discuss with gastroenterologist. Recommend to avoid eating fast.  -Continue good hydration. -Follow up in 1 year for CPE and FBW. Lab visit in 6 months to repeat lipid panel.   Orders Placed This Encounter  Procedures  . Flu Vaccine QUAD 6+ mos PF IM (Fluarix Quad PF)  . Ambulatory referral to Gastroenterology    Referral Priority:   Routine    Referral Type:   Consultation    Referral Reason:   Specialty Services Required    Number of Visits Requested:   1    No orders of the  defined types were placed in this encounter.    Return in about 1 year (around 01/23/2022) for CPE with FBW few days before,  lab visit in 6 months for lipid.  Reminded pt important of f-up preventative CPE in 1 year.  Reminded pt again, this is in addition to any chronic care visits.    Gross side effects, risk and benefits, and alternatives of medications discussed with patient.  Patient is aware that all medications have potential side effects and we are unable to predict every side effect or drug-drug interaction that may occur.  Expresses verbal understanding and consents to current therapy plan and treatment regimen.  Please see AVS handed out to patient at the end of our visit for further patient instructions/ counseling done pertaining to today's office visit.       Subjective:        CC: CPE   HPI: Omar Beard. is a 45 y.o. male who presents to [facility] at [facility] today for a yearly health maintenance exam.     Health Maintenance Summary  - Reviewed and updated, unless pt declines services.  Last Cologuard or Colonoscopy:   Placed order for screening colonoscopy. Family history of Colon CA: N  Tobacco History Reviewed: Y, never a smoker Alcohol / drug use:    No concerns, no excessive use / no use Exercise Habits:   Not recently due to recovering from illness Dental Home:  Y Eye exams: Y Dermatology home:N  Male  history: STD concerns:   none  Additional penile/ urinary concerns: none   Additional concerns beyond Health Maintenance issues:   Sometimes food getting stuck in chest.     Immunization History  Administered Date(s) Administered  . Influenza,inj,Quad PF,6+ Mos 10/22/2019, 01/23/2021  . Tdap 04/02/2013     Health Maintenance  Topic Date Due  . Hepatitis C Screening  Never done  . COVID-19 Vaccine (1) Never done  . HIV Screening  Never done  . COLONOSCOPY (Pts 45-35yrs Insurance coverage will need to be confirmed)   Never done  . TETANUS/TDAP  04/03/2023  . INFLUENZA VACCINE  Completed       Wt Readings from Last 3 Encounters:  01/23/21 205 lb 4.8 oz (93.1 kg)  08/24/19 200 lb 11.2 oz (91 kg)  11/26/18 197 lb 1.6 oz (89.4 kg)   BP Readings from Last 3 Encounters:  01/23/21 107/64  08/24/19 (!) 102/57  11/26/18 122/70   Pulse Readings from Last 3 Encounters:  01/23/21 81  08/24/19 (!) 59  11/26/18 79    Patient Active Problem List   Diagnosis Date Noted  . Healthcare maintenance 11/26/2018  . Chronic sinusitis 04/14/2018  . Deviated nasal septum 04/14/2018  . Hypertrophy of nasal turbinates 04/14/2018  . Hyperkalemia 02/27/2012    History reviewed. No pertinent past medical history.  Past Surgical History:  Procedure Laterality Date  . DEBRIDEMENT PREPATELLAR BURSA    . VASECTOMY    . VASECTOMY REVERSAL    . WISDOM TOOTH EXTRACTION      History reviewed. No pertinent family history.  Social History   Substance and Sexual Activity  Drug Use Never  ,  Social History   Substance and Sexual Activity  Alcohol Use Not Currently  ,  Social History   Tobacco Use  Smoking Status Never Smoker  Smokeless Tobacco Never Used  ,  Social History   Substance and Sexual Activity  Sexual Activity Yes  . Birth control/protection: None    Patient's Medications  New Prescriptions   No medications on file  Previous Medications   BLACK ELDERBERRY 50 MG/5ML SYRP    Take 15 mLs by mouth daily.   MULTIPLE VITAMINS-MINERALS (MULTIVITAMIN WITH MINERALS) TABLET    Take 1 tablet by mouth daily.   SUPER B COMPLEX/C PO    Take 1 tablet by mouth daily.  Modified Medications   No medications on file  Discontinued Medications   No medications on file    Patient has no known allergies.  Review of Systems: General:   Denies fever, chills, unexplained weight loss.  Optho/Auditory:   Denies visual changes, blurred vision/LOV Respiratory:   Denies SOB, DOE more than baseline levels.    Cardiovascular:   Denies chest pain, palpitations, new onset peripheral edema  Gastrointestinal:   Denies nausea, vomiting, diarrhea.  Genitourinary: Denies dysuria, freq/ urgency, flank pain   Endocrine:     Denies hot or cold intolerance, polyuria, polydipsia. Musculoskeletal:   Denies unexplained myalgias, joint swelling, unexplained arthralgias, gait problems.  Skin:  Denies rash, suspicious lesions Neurological:     Denies dizziness, unexplained weakness, numbness  Psychiatric/Behavioral:   Denies mood changes, suicidal or homicidal ideations, hallucinations    Objective:     Blood pressure 107/64, pulse 81, height 5\' 11"  (1.803 m), weight 205 lb 4.8 oz (93.1 kg), SpO2 98 %. Body mass index is 28.63 kg/m. General Appearance:    Alert, cooperative, no distress, appears stated age  Head:    Normocephalic, without  obvious abnormality, atraumatic  Eyes:    PERRL, conjunctiva/corneas clear, EOM's intact, both eyes  Ears:    Normal TM's and external ear canals, both ears  Nose:   Nares normal, septum midline, mucosa normal, no drainage    or sinus tenderness  Throat:   Lips w/o lesion, mucosa moist, and tongue normal; teeth and gums normal  Neck:   Supple, symmetrical, trachea midline, no adenopathy;    thyroid:  no enlargement/tenderness/nodules; no JVD  Back:     Symmetric, no curvature, ROM normal, no CVA tenderness  Lungs:     Clear to auscultation bilaterally, respirations unlabored, no  Wh/ R/ R  Chest Wall:    No tenderness or gross deformity; normal excursion   Heart:    Regular rate and rhythm, S1 and S2 normal, no murmur, rub   or gallop  Abdomen:     Soft, non-tender, bowel sounds active all four quadrants, No  G/R/R, no masses, no organomegaly  Genitalia:   Deferred. No concerns/sxs.  Rectal:   Deferred. No concerns/sxs.  Extremities:   Extremities normal, atraumatic, no cyanosis or gross edema  Pulses:   2+ and symmetric all extremities  Skin:   Warm, dry, Skin  color, texture, turgor normal, no obvious rashes or lesions  M-Sk:   Ambulates * 4 w/o difficulty, no gross deformities, tone WNL  Neurologic:   CNII-XII grossly intact, normal strength, sensation and reflexes    Throughout Psych:  No HI/SI, judgement and insight good, Euthymic mood. Full Affect.

## 2022-01-18 ENCOUNTER — Other Ambulatory Visit: Payer: Self-pay

## 2022-01-18 DIAGNOSIS — Z130 Encounter for screening for diseases of the blood and blood-forming organs and certain disorders involving the immune mechanism: Secondary | ICD-10-CM

## 2022-01-18 DIAGNOSIS — Z0000 Encounter for general adult medical examination without abnormal findings: Secondary | ICD-10-CM

## 2022-01-18 DIAGNOSIS — Z1321 Encounter for screening for nutritional disorder: Secondary | ICD-10-CM

## 2022-01-21 ENCOUNTER — Other Ambulatory Visit: Payer: No Typology Code available for payment source

## 2022-01-21 ENCOUNTER — Other Ambulatory Visit: Payer: Self-pay

## 2022-01-21 DIAGNOSIS — Z0000 Encounter for general adult medical examination without abnormal findings: Secondary | ICD-10-CM

## 2022-01-21 DIAGNOSIS — Z130 Encounter for screening for diseases of the blood and blood-forming organs and certain disorders involving the immune mechanism: Secondary | ICD-10-CM

## 2022-01-21 DIAGNOSIS — Z1321 Encounter for screening for nutritional disorder: Secondary | ICD-10-CM

## 2022-01-22 LAB — CBC WITH DIFFERENTIAL/PLATELET
Basophils Absolute: 0.1 10*3/uL (ref 0.0–0.2)
Basos: 1 %
EOS (ABSOLUTE): 0.1 10*3/uL (ref 0.0–0.4)
Eos: 2 %
Hematocrit: 46 % (ref 37.5–51.0)
Hemoglobin: 15.6 g/dL (ref 13.0–17.7)
Immature Grans (Abs): 0 10*3/uL (ref 0.0–0.1)
Immature Granulocytes: 0 %
Lymphocytes Absolute: 1.8 10*3/uL (ref 0.7–3.1)
Lymphs: 38 %
MCH: 28.3 pg (ref 26.6–33.0)
MCHC: 33.9 g/dL (ref 31.5–35.7)
MCV: 83 fL (ref 79–97)
Monocytes Absolute: 0.5 10*3/uL (ref 0.1–0.9)
Monocytes: 11 %
Neutrophils Absolute: 2.3 10*3/uL (ref 1.4–7.0)
Neutrophils: 48 %
Platelets: 317 10*3/uL (ref 150–450)
RBC: 5.52 x10E6/uL (ref 4.14–5.80)
RDW: 12.8 % (ref 11.6–15.4)
WBC: 4.8 10*3/uL (ref 3.4–10.8)

## 2022-01-22 LAB — COMPREHENSIVE METABOLIC PANEL
ALT: 17 IU/L (ref 0–44)
AST: 22 IU/L (ref 0–40)
Albumin/Globulin Ratio: 2.1 (ref 1.2–2.2)
Albumin: 4.7 g/dL (ref 4.0–5.0)
Alkaline Phosphatase: 44 IU/L (ref 44–121)
BUN/Creatinine Ratio: 9 (ref 9–20)
BUN: 10 mg/dL (ref 6–24)
Bilirubin Total: 0.7 mg/dL (ref 0.0–1.2)
CO2: 27 mmol/L (ref 20–29)
Calcium: 9.7 mg/dL (ref 8.7–10.2)
Chloride: 103 mmol/L (ref 96–106)
Creatinine, Ser: 1.15 mg/dL (ref 0.76–1.27)
Globulin, Total: 2.2 g/dL (ref 1.5–4.5)
Glucose: 85 mg/dL (ref 70–99)
Potassium: 4.6 mmol/L (ref 3.5–5.2)
Sodium: 142 mmol/L (ref 134–144)
Total Protein: 6.9 g/dL (ref 6.0–8.5)
eGFR: 79 mL/min/{1.73_m2} (ref >59)

## 2022-01-22 LAB — LIPID PANEL
Chol/HDL Ratio: 3.2 ratio (ref 0.0–5.0)
Cholesterol, Total: 193 mg/dL (ref 100–199)
HDL: 61 mg/dL (ref >39)
LDL Chol Calc (NIH): 120 mg/dL — ABNORMAL HIGH (ref 0–99)
Triglycerides: 65 mg/dL (ref 0–149)
VLDL Cholesterol Cal: 12 mg/dL (ref 5–40)

## 2022-01-22 LAB — HEMOGLOBIN A1C
Est. average glucose Bld gHb Est-mCnc: 105 mg/dL
Hgb A1c MFr Bld: 5.3 % (ref 4.8–5.6)

## 2022-01-22 LAB — TSH: TSH: 1.33 u[IU]/mL (ref 0.450–4.500)

## 2022-01-24 NOTE — Progress Notes (Signed)
Complete physical exam   Patient: Omar Beard.   DOB: 10-21-1976   45 y.o. Male  MRN: [id number] Visit Date: 01/25/2022   Chief Complaint  Patient presents with   Annual Exam   Subjective    Omar Beard. is a 46 y.o. male who presents today for a complete physical exam.  He reports consuming a low fat diet. The patient does not participate in regular exercise at present. He generally feels fairly well. He does not have additional problems to discuss today.     History reviewed. No pertinent past medical history. Past Surgical History:  Procedure Laterality Date   DEBRIDEMENT PREPATELLAR BURSA     VASECTOMY     VASECTOMY REVERSAL     WISDOM TOOTH EXTRACTION     Social History   Socioeconomic History   Marital status: Married    Spouse name: Not on file   Number of children: Not on file   Years of education: Not on file   Highest education level: Not on file  Occupational History   Not on file  Tobacco Use   Smoking status: Never   Smokeless tobacco: Never  Vaping Use   Vaping Use: Never used  Substance and Sexual Activity   Alcohol use: Not Currently   Drug use: Never   Sexual activity: Yes    Birth control/protection: None  Other Topics Concern   Not on file  Social History Narrative   Not on file   Social Determinants of Health   Financial Resource Strain: Not on file  Food Insecurity: Not on file  Transportation Needs: Not on file  Physical Activity: Not on file  Stress: Not on file  Social Connections: Not on file  Intimate Partner Violence: Not on file     Medications: Outpatient Medications Prior to Visit  Medication Sig   Black Elderberry 50 MG/5ML SYRP Take 15 mLs by mouth daily.   Multiple Vitamins-Minerals (MULTIVITAMIN WITH MINERALS) tablet Take 1 tablet by mouth daily.   SUPER B COMPLEX/C PO Take 1 tablet by mouth daily.   No facility-administered medications prior to visit.    Review of Systems Review of Systems:  A  fourteen system review of systems was performed and found to be positive as per HPI.    Objective    BP 125/84    Pulse 78    Temp 97.8 F (36.6 C)    Ht 5\' 11"  (1.803 m)    Wt 198 lb (89.8 kg)    SpO2 100%    BMI 27.62 kg/m    Physical Exam   General Appearance:       Alert, cooperative, in no acute distress, appears stated age  Head:    Normocephalic, without obvious abnormality, atraumatic  Eyes:    PERRL, conjunctiva/corneas clear, EOM's intact, fundi    benign, both eyes       Ears:    Normal TM's and external ear canals, both ears  Nose:   Nares normal, septum midline, mucosa normal, no drainage   or sinus tenderness  Throat:   Lips, mucosa, and tongue normal; teeth and gums normal  Neck:   Supple, symmetrical, trachea midline, no adenopathy;       thyroid:  No enlargement/tenderness/nodules; no carotid   bruit or JVD  Back:     Symmetric, no curvature, ROM normal, no CVA tenderness  Lungs:     Clear to auscultation bilaterally, respirations unlabored  Chest wall:    No tenderness  or deformity  Heart:    Normal heart rate. Normal rhythm. No murmurs, rubs, or gallops.  S1 and S2 normal  Abdomen:     Soft, non-tender, bowel sounds active all four quadrants,    no masses, no organomegaly  Genitalia:    deferred  Rectal:    deferred  Extremities:   All extremities are intact. No cyanosis or edema  Pulses:   2+ and symmetric all extremities  Skin:   Skin color, texture, turgor normal, no rashes or lesions  Lymph nodes:   Cervical, supraclavicular, and axillary nodes normal  Neurologic:   CNII-XII intact. Normal strength, sensation and reflexes      throughout     Last depression screening scores PHQ 2/9 Scores 01/25/2022 01/23/2021 08/24/2019  PHQ - 2 Score 0 0 0  PHQ- 9 Score 0 0 0   Last fall risk screening Fall Risk  01/25/2022  Falls in the past year? 0  Number falls in past yr: 0  Injury with Fall? 0  Risk for fall due to : No Fall Risks  Follow up Falls evaluation  completed     No results found for any visits on 01/25/22.  Assessment & Plan    Routine Health Maintenance and Physical Exam  Exercise Activities and Dietary recommendations -Discussed heart healthy diet low in fat and carbohydrates. Recommend moderate exercise 150 mins/wk.  Immunization History  Administered Date(s) Administered   Influenza,inj,Quad PF,6+ Mos 10/22/2019, 01/23/2021   Tdap 04/02/2013    Health Maintenance  Topic Date Due   COVID-19 Vaccine (1) Never done   HIV Screening  Never done   Hepatitis C Screening  Never done   COLONOSCOPY (Pts 45-97yrs Insurance coverage will need to be confirmed)  Never done   INFLUENZA VACCINE  07/23/2021   TETANUS/TDAP  04/03/2023   HPV VACCINES  Aged Out    Discussed health benefits of physical activity, and encouraged him to engage in regular exercise appropriate for his age and condition.  Problem List Items Addressed This Visit       Other   Healthcare maintenance - Primary   Other Visit Diagnoses     Screening for colon cancer       Relevant Orders   Ambulatory referral to Gastroenterology   Need for influenza vaccination       Relevant Orders   Flu Vaccine QUAD 61mo+IM (Fluarix, Fluzone & Alfiuria Quad PF)      Discussed with patient most recent lab results which are essentially within normal limits or stable from prior. The 10-year ASCVD risk score (Arnett DK, et al., 2019) is: 1.6% Patient agreeable to screening colonoscopy and influenza vaccine. Declined Hep C and HIV screenings. Continue with good hydration.  Return in about 1 year (around 01/25/2023) for CPE and FBW.       Mayer Masker, PA-C  [facility] at [facility] [phone] (phone) [phone] (fax)  [facility]

## 2022-01-25 ENCOUNTER — Ambulatory Visit (INDEPENDENT_AMBULATORY_CARE_PROVIDER_SITE_OTHER): Payer: No Typology Code available for payment source | Admitting: Physician Assistant

## 2022-01-25 ENCOUNTER — Encounter: Payer: Self-pay | Admitting: Physician Assistant

## 2022-01-25 ENCOUNTER — Other Ambulatory Visit: Payer: Self-pay

## 2022-01-25 VITALS — BP 125/84 | HR 78 | Temp 97.8°F | Ht 71.0 in | Wt 198.0 lb

## 2022-01-25 DIAGNOSIS — Z0000 Encounter for general adult medical examination without abnormal findings: Secondary | ICD-10-CM

## 2022-01-25 DIAGNOSIS — Z23 Encounter for immunization: Secondary | ICD-10-CM

## 2022-01-25 DIAGNOSIS — Z1211 Encounter for screening for malignant neoplasm of colon: Secondary | ICD-10-CM

## 2022-01-25 NOTE — Patient Instructions (Signed)

## 2023-01-17 ENCOUNTER — Other Ambulatory Visit: Payer: Self-pay | Admitting: Nurse Practitioner

## 2023-01-17 DIAGNOSIS — Z0000 Encounter for general adult medical examination without abnormal findings: Secondary | ICD-10-CM

## 2023-01-17 DIAGNOSIS — Z130 Encounter for screening for diseases of the blood and blood-forming organs and certain disorders involving the immune mechanism: Secondary | ICD-10-CM

## 2023-01-27 ENCOUNTER — Encounter: Payer: No Typology Code available for payment source | Admitting: Physician Assistant

## 2023-02-19 ENCOUNTER — Other Ambulatory Visit: Payer: Self-pay

## 2023-02-19 DIAGNOSIS — Z0000 Encounter for general adult medical examination without abnormal findings: Secondary | ICD-10-CM

## 2023-02-19 DIAGNOSIS — Z130 Encounter for screening for diseases of the blood and blood-forming organs and certain disorders involving the immune mechanism: Secondary | ICD-10-CM

## 2023-02-20 ENCOUNTER — Other Ambulatory Visit: Payer: No Typology Code available for payment source

## 2023-02-20 DIAGNOSIS — Z0000 Encounter for general adult medical examination without abnormal findings: Secondary | ICD-10-CM

## 2023-02-20 DIAGNOSIS — Z1329 Encounter for screening for other suspected endocrine disorder: Secondary | ICD-10-CM

## 2023-02-21 LAB — CBC
Hematocrit: 44.9 % (ref 37.5–51.0)
Hemoglobin: 15.3 g/dL (ref 13.0–17.7)
MCH: 28.9 pg (ref 26.6–33.0)
MCHC: 34.1 g/dL (ref 31.5–35.7)
MCV: 85 fL (ref 79–97)
Platelets: 342 10*3/uL (ref 150–450)
RBC: 5.29 x10E6/uL (ref 4.14–5.80)
RDW: 12.5 % (ref 11.6–15.4)
WBC: 7.5 10*3/uL (ref 3.4–10.8)

## 2023-02-21 LAB — COMPREHENSIVE METABOLIC PANEL
ALT: 25 IU/L (ref 0–44)
AST: 26 IU/L (ref 0–40)
Albumin/Globulin Ratio: 2.2 (ref 1.2–2.2)
Albumin: 4.8 g/dL (ref 4.1–5.1)
Alkaline Phosphatase: 45 IU/L (ref 44–121)
BUN/Creatinine Ratio: 9 (ref 9–20)
BUN: 9 mg/dL (ref 6–24)
Bilirubin Total: 0.5 mg/dL (ref 0.0–1.2)
CO2: 25 mmol/L (ref 20–29)
Calcium: 9.8 mg/dL (ref 8.7–10.2)
Chloride: 97 mmol/L (ref 96–106)
Creatinine, Ser: 1.02 mg/dL (ref 0.76–1.27)
Globulin, Total: 2.2 g/dL (ref 1.5–4.5)
Glucose: 86 mg/dL (ref 70–99)
Potassium: 4.7 mmol/L (ref 3.5–5.2)
Sodium: 136 mmol/L (ref 134–144)
Total Protein: 7 g/dL (ref 6.0–8.5)
eGFR: 91 mL/min/{1.73_m2} (ref >59)

## 2023-02-21 LAB — LIPID PANEL
Chol/HDL Ratio: 4.1 ratio (ref 0.0–5.0)
Cholesterol, Total: 197 mg/dL (ref 100–199)
HDL: 48 mg/dL (ref >39)
LDL Chol Calc (NIH): 129 mg/dL — ABNORMAL HIGH (ref 0–99)
Triglycerides: 110 mg/dL (ref 0–149)
VLDL Cholesterol Cal: 20 mg/dL (ref 5–40)

## 2023-02-21 LAB — HEMOGLOBIN A1C
Est. average glucose Bld gHb Est-mCnc: 114 mg/dL
Hgb A1c MFr Bld: 5.6 % (ref 4.8–5.6)

## 2023-02-21 LAB — TSH: TSH: 1.23 u[IU]/mL (ref 0.450–4.500)

## 2023-02-24 NOTE — Progress Notes (Unsigned)
   Complete physical exam  Patient: Omar Beard.   DOB: [birth year]   47 y.o. Male  MRN: [id number]  Subjective:    No chief complaint on file.   Omar Beard. is a 47 y.o. male who presents today for a complete physical exam. He reports consuming a {diet types:17450} diet. {types:19826} He generally feels {DESC; WELL/FAIRLY WELL/POORLY:18703}. He reports sleeping {DESC; WELL/FAIRLY WELL/POORLY:18703}. He {does/does not:200015} have additional problems to discuss today.    Most recent fall risk assessment:    01/25/2022    9:21 AM  Airport in the past year? 0  Number falls in past yr: 0  Injury with Fall? 0  Risk for fall due to : No Fall Risks  Follow up Falls evaluation completed     Most recent depression screenings:    01/25/2022    9:21 AM 01/23/2021    4:13 PM  PHQ 2/9 Scores  PHQ - 2 Score 0 0  PHQ- 9 Score 0 0    Patient Active Problem List   Diagnosis Date Noted   Healthcare maintenance 11/26/2018   Chronic sinusitis 04/14/2018   Deviated nasal septum 04/14/2018   Hypertrophy of nasal turbinates 04/14/2018   Hyperkalemia 02/27/2012    Past Surgical History:  Procedure Laterality Date   DEBRIDEMENT PREPATELLAR BURSA     VASECTOMY     VASECTOMY REVERSAL     WISDOM TOOTH EXTRACTION     Social History   Tobacco Use   Smoking status: Never   Smokeless tobacco: Never  Vaping Use   Vaping Use: Never used  Substance Use Topics   Alcohol use: Not Currently   Drug use: Never   No family history on file. No Known Allergies   Patient Care Team: Lorrene Reid, PA-C (Inactive) as PCP - General (Physician Assistant) Jodi Marble, MD as Consulting Physician (Otolaryngology)   Outpatient Medications Prior to Visit  Medication Sig   Black Elderberry 50 MG/5ML SYRP Take 15 mLs by mouth daily.   Multiple Vitamins-Minerals (MULTIVITAMIN WITH MINERALS) tablet Take 1 tablet by mouth daily.   SUPER B COMPLEX/C PO Take 1 tablet by mouth daily.   No  facility-administered medications prior to visit.    ROS     Objective:    There were no vitals taken for this visit.   Physical Exam   No results found for any visits on 02/25/23.     Assessment & Plan:    Routine Health Maintenance and Physical Exam  Immunization History  Administered Date(s) Administered   Influenza,inj,Quad PF,6+ Mos 10/22/2019, 01/23/2021, 01/25/2022   Tdap 04/02/2013    Health Maintenance  Topic Date Due   COVID-19 Vaccine (1) Never done   HIV Screening  Never done   Hepatitis C Screening  Never done   COLONOSCOPY (Pts 45-33yr Insurance coverage will need to be confirmed)  Never done   INFLUENZA VACCINE  07/23/2022   DTaP/Tdap/Td (2 - Td or Tdap) 04/03/2023   HPV VACCINES  Aged Out    Discussed health benefits of physical activity, and encouraged him to engage in regular exercise appropriate for his age and condition.  There are no diagnoses linked to this encounter.  No follow-ups on file.     MVelva Harman PA

## 2023-02-25 ENCOUNTER — Encounter: Payer: Self-pay | Admitting: Family Medicine

## 2023-02-25 ENCOUNTER — Ambulatory Visit: Payer: No Typology Code available for payment source | Admitting: Family Medicine

## 2023-02-25 VITALS — BP 121/77 | HR 67 | Resp 18 | Ht 71.0 in | Wt 203.0 lb

## 2023-02-25 DIAGNOSIS — Z23 Encounter for immunization: Secondary | ICD-10-CM

## 2023-02-25 DIAGNOSIS — Z1211 Encounter for screening for malignant neoplasm of colon: Secondary | ICD-10-CM

## 2023-02-25 DIAGNOSIS — Z0000 Encounter for general adult medical examination without abnormal findings: Secondary | ICD-10-CM

## 2023-03-31 ENCOUNTER — Encounter: Payer: Self-pay | Admitting: Gastroenterology

## 2023-04-17 ENCOUNTER — Ambulatory Visit: Payer: No Typology Code available for payment source

## 2023-04-17 VITALS — Ht 71.0 in | Wt 205.0 lb

## 2023-04-17 DIAGNOSIS — Z1211 Encounter for screening for malignant neoplasm of colon: Secondary | ICD-10-CM

## 2023-04-17 MED ORDER — NA SULFATE-K SULFATE-MG SULF 17.5-3.13-1.6 GM/177ML PO SOLN
1.0000 | Freq: Once | ORAL | 0 refills | Status: AC
Start: 2023-04-17 — End: 2023-04-17

## 2023-04-17 NOTE — Progress Notes (Signed)

## 2023-04-28 ENCOUNTER — Encounter: Payer: Self-pay | Admitting: Gastroenterology

## 2023-05-05 ENCOUNTER — Telehealth: Payer: Self-pay | Admitting: Gastroenterology

## 2023-05-05 ENCOUNTER — Other Ambulatory Visit: Payer: Self-pay

## 2023-05-05 DIAGNOSIS — Z1211 Encounter for screening for malignant neoplasm of colon: Secondary | ICD-10-CM

## 2023-05-05 MED ORDER — NA SULFATE-K SULFATE-MG SULF 17.5-3.13-1.6 GM/177ML PO SOLN: 1.0000 | Freq: Once | ORAL | 0 refills | Status: AC

## 2023-05-05 NOTE — Telephone Encounter (Signed)
Patient call stating that his prep medication has not been sent into [facility] pharmacy on [address]. Requesting a call back once the prescription has been called in. Please advise, thank you.

## 2023-05-05 NOTE — Telephone Encounter (Signed)
New rx sent. Left VM making patient aware

## 2023-05-16 ENCOUNTER — Ambulatory Visit: Payer: No Typology Code available for payment source | Admitting: Gastroenterology

## 2023-05-16 ENCOUNTER — Encounter: Payer: Self-pay | Admitting: Gastroenterology

## 2023-05-16 VITALS — BP 101/64 | HR 66 | Temp 98.6°F | Resp 10 | Ht 71.0 in | Wt 205.0 lb

## 2023-05-16 DIAGNOSIS — Z1211 Encounter for screening for malignant neoplasm of colon: Secondary | ICD-10-CM | POA: Diagnosis not present

## 2023-05-16 DIAGNOSIS — D128 Benign neoplasm of rectum: Secondary | ICD-10-CM

## 2023-05-16 MED ORDER — SODIUM CHLORIDE 0.9 % IV SOLN
500.0000 mL | Freq: Once | INTRAVENOUS | Status: DC
Start: 2023-05-16 — End: 2023-05-16

## 2023-05-16 NOTE — Op Note (Signed)
[facility] Patient Name: Omar Beard Procedure Date: 05/16/2023 12:30 PM MRN: [id number] Endoscopist: Lorin Picket E. Tomasa Rand , MD, [id number] Age: 47 Referring MD:  Date of Birth: 04-03-1976 Gender: Male Account #: [id number] Procedure:                Colonoscopy Indications:              Screening for colorectal malignant neoplasm, This                            is the patient's first colonoscopy Medicines:                Monitored Anesthesia Care Procedure:                Pre-Anesthesia Assessment:                           - Prior to the procedure, a History and Physical                            was performed, and patient medications and                            allergies were reviewed. The patient's tolerance of                            previous anesthesia was also reviewed. The risks                            and benefits of the procedure and the sedation                            options and risks were discussed with the patient.                            All questions were answered, and informed consent                            was obtained. Prior Anticoagulants: The patient has                            taken no anticoagulant or antiplatelet agents. ASA                            Grade Assessment: II - A patient with mild systemic                            disease. After reviewing the risks and benefits,                            the patient was deemed in satisfactory condition to                            undergo the procedure.  After obtaining informed consent, the colonoscope                            was passed under direct vision. Throughout the                            procedure, the patient's blood pressure, pulse, and                            oxygen saturations were monitored continuously. The                            CF [id number] #[id number] was introduced through the anus                            and advanced to the  the terminal ileum, with                            identification of the appendiceal orifice and IC                            valve. The colonoscopy was performed without                            difficulty. The patient tolerated the procedure                            well. The quality of the bowel preparation was                            adequate. The terminal ileum, ileocecal valve,                            appendiceal orifice, and rectum were photographed.                            The bowel preparation used was SUPREP via split                            dose instruction. Scope In: 12:36:04 PM Scope Out: 12:53:04 PM Scope Withdrawal Time: 0 hours 10 minutes 50 seconds  Total Procedure Duration: 0 hours 17 minutes 0 seconds  Findings:                 The perianal and digital rectal examinations were                            normal. Pertinent negatives include normal                            sphincter tone and no palpable rectal lesions.                           A 5 mm polyp was found in the rectum. The polyp was  sessile. The polyp was removed with a cold snare.                            Resection and retrieval were complete. Estimated                            blood loss was minimal.                           Multiple small-mouthed diverticula were found in                            the sigmoid colon.                           The exam was otherwise normal throughout the                            examined colon.                           The terminal ileum appeared normal.                           The retroflexed view of the distal rectum and anal                            verge was normal and showed no anal or rectal                            abnormalities. Complications:            No immediate complications. Estimated Blood Loss:     Estimated blood loss was minimal. Impression:               - One 5 mm polyp in the rectum, removed  with a cold                            snare. Resected and retrieved.                           - Mild diverticulosis in the sigmoid colon.                           - The examined portion of the ileum was normal.                           - The distal rectum and anal verge are normal on                            retroflexion view. Recommendation:           - Patient has a contact number available for                            emergencies. The signs and symptoms of potential  delayed complications were discussed with the                            patient. Return to normal activities tomorrow.                            Written discharge instructions were provided to the                            patient.                           - Resume previous diet.                           - Continue present medications.                           - Await pathology results.                           - Repeat colonoscopy (date not yet determined) for                            surveillance based on pathology results. Deshanti Adcox E. Tomasa Rand, MD 05/16/2023 12:58:10 PM This report has been signed electronically.

## 2023-05-16 NOTE — Patient Instructions (Signed)
Handout on polyps.   YOU HAD AN ENDOSCOPIC PROCEDURE TODAY AT THE [facility]:   Refer to the procedure report that was given to you for any specific questions about what was found during the examination.  If the procedure report does not answer your questions, please call your gastroenterologist to clarify.  If you requested that your care partner not be given the details of your procedure findings, then the procedure report has been included in a sealed envelope for you to review at your convenience later.  YOU SHOULD EXPECT: Some feelings of bloating in the abdomen. Passage of more gas than usual.  Walking can help get rid of the air that was put into your GI tract during the procedure and reduce the bloating. If you had a lower endoscopy (such as a colonoscopy or flexible sigmoidoscopy) you may notice spotting of blood in your stool or on the toilet paper. If you underwent a bowel prep for your procedure, you may not have a normal bowel movement for a few days.  Please Note:  You might notice some irritation and congestion in your nose or some drainage.  This is from the oxygen used during your procedure.  There is no need for concern and it should clear up in a day or so.  SYMPTOMS TO REPORT IMMEDIATELY:  Following lower endoscopy (colonoscopy or flexible sigmoidoscopy):  Excessive amounts of blood in the stool  Significant tenderness or worsening of abdominal pains  Swelling of the abdomen that is new, acute  Fever of 100F or higher  For urgent or emergent issues, a gastroenterologist can be reached at any hour by calling [phone]. Do not use MyChart messaging for urgent concerns.    DIET:  We do recommend a small meal at first, but then you may proceed to your regular diet.  Drink plenty of fluids but you should avoid alcoholic beverages for 24 hours.  ACTIVITY:  You should plan to take it easy for the rest of today and you should NOT DRIVE or use heavy machinery  until tomorrow (because of the sedation medicines used during the test).    FOLLOW UP: Our staff will call the number listed on your records the next business day following your procedure.  We will call around 7:15- 8:00 am to check on you and address any questions or concerns that you may have regarding the information given to you following your procedure. If we do not reach you, we will leave a message.     If any biopsies were taken you will be contacted by phone or by letter within the next 1-3 weeks.  Please call us at [phone] if you have not heard about the biopsies in 3 weeks.    SIGNATURES/CONFIDENTIALITY: You and/or your care partner have signed paperwork which will be entered into your electronic medical record.  These signatures attest to the fact that that the information above on your After Visit Summary has been reviewed and is understood.  Full responsibility of the confidentiality of this discharge information lies with you and/or your care-partner.

## 2023-05-16 NOTE — Progress Notes (Signed)
Vitals-DT  Pt's states no medical or surgical changes since previsit or office visit.  

## 2023-05-16 NOTE — Progress Notes (Signed)
[facility] History and Physical   Primary Care Physician:  Melida Quitter, PA   Reason for Procedure:   Colon cancer screening  Plan:    Screening colonoscopy     HPI: Omar Beard. is a 47 y.o. male undergoing initial average risk screening colonoscopy.  He has no family history of colon cancer and no chronic GI symptoms.    Past Medical History:  Diagnosis Date   Allergy     Past Surgical History:  Procedure Laterality Date   DEBRIDEMENT PREPATELLAR BURSA     VASECTOMY     VASECTOMY REVERSAL     WISDOM TOOTH EXTRACTION      Prior to Admission medications   Medication Sig Start Date End Date Taking? Authorizing Provider  Black Elderberry 50 MG/5ML SYRP Take 15 mLs by mouth daily.   Yes [provider]  Multiple Vitamins-Minerals (MULTIVITAMIN WITH MINERALS) tablet Take 1 tablet by mouth daily.   Yes [provider]  SUPER B COMPLEX/C PO Take 1 tablet by mouth daily.   Yes [provider]  Turmeric (QC TUMERIC COMPLEX PO) Take by mouth.   Yes [provider]    Current Outpatient Medications  Medication Sig Dispense Refill   Black Elderberry 50 MG/5ML SYRP Take 15 mLs by mouth daily.     Multiple Vitamins-Minerals (MULTIVITAMIN WITH MINERALS) tablet Take 1 tablet by mouth daily.     SUPER B COMPLEX/C PO Take 1 tablet by mouth daily.     Turmeric (QC TUMERIC COMPLEX PO) Take by mouth.     Current Facility-Administered Medications  Medication Dose Route Frequency Provider Last Rate Last Admin   0.9 %  sodium chloride infusion  500 mL Intravenous Once Jenel Lucks, MD        Allergies as of 05/16/2023   (No Known Allergies)    Family History  Problem Relation Age of Onset   Colon cancer Neg Hx    Colon polyps Neg Hx    Esophageal cancer Neg Hx    Rectal cancer Neg Hx    Stomach cancer Neg Hx     Social History   Socioeconomic History   Marital status: Married    Spouse name: Not on file    Number of children: Not on file   Years of education: Not on file   Highest education level: Not on file  Occupational History   Not on file  Tobacco Use   Smoking status: Never   Smokeless tobacco: Never  Vaping Use   Vaping Use: Never used  Substance and Sexual Activity   Alcohol use: Not Currently   Drug use: Never   Sexual activity: Yes    Birth control/protection: None  Other Topics Concern   Not on file  Social History Narrative   Not on file   Social Determinants of Health   Financial Resource Strain: Not on file  Food Insecurity: Not on file  Transportation Needs: Not on file  Physical Activity: Not on file  Stress: Not on file  Social Connections: Not on file  Intimate Partner Violence: Not on file    Review of Systems:  All other review of systems negative except as mentioned in the HPI.  Physical Exam: Vital signs BP 124/72 (BP Location: Right Arm, Patient Position: Sitting, Cuff Size: Normal)   Pulse 62   Temp 98.6 F (37 C) (Temporal)   Ht 5\' 11"  (1.803 m)   Wt 205 lb (93 kg)   SpO2 100%  BMI 28.59 kg/m   General:   Alert,  Well-developed, well-nourished, pleasant and cooperative in NAD Airway:  Mallampati 1 Lungs:  Clear throughout to auscultation.   Heart:  Regular rate and rhythm; no murmurs, clicks, rubs,  or gallops. Abdomen:  Soft, nontender and nondistended. Normal bowel sounds.   Neuro/Psych:  Normal mood and affect. A and O x 3   Bird Tailor E. Tomasa Rand, MD [facility]

## 2023-05-16 NOTE — Progress Notes (Signed)
Report to PACU, RN, vss, BBS= Clear.  

## 2023-05-20 ENCOUNTER — Telehealth: Payer: Self-pay

## 2023-05-20 NOTE — Telephone Encounter (Signed)
  Follow up Call-     05/16/2023   11:22 AM 05/16/2023   11:17 AM  Call back number  Post procedure Call Back phone  # [phone]   Permission to leave phone message  Yes     Patient questions:  Do you have a fever, pain , or abdominal swelling? No. Pain Score  0 *  Have you tolerated food without any problems? Yes.    Have you been able to return to your normal activities? Yes.    Do you have any questions about your discharge instructions: Diet   No. Medications  No. Follow up visit  No.  Do you have questions or concerns about your Care? No.  Actions: * If pain score is 4 or above: No action needed, pain <4.

## 2023-05-27 NOTE — Progress Notes (Signed)
Omar Beard,  The polyp which I removed during your recent procedure was proven to be completely benign but is considered a "pre-cancerous" polyp that MAY have grown into cancer if it had not been removed.  Studies shows that at least 20% of women over age 48 and 30% of men over age 28 have pre-cancerous polyps.  Based on current nationally recognized surveillance guidelines, I recommend that you have a repeat colonoscopy in 7 years.   If you develop any new rectal bleeding, abdominal pain or significant bowel habit changes, please contact me before then.

## 2023-11-24 ENCOUNTER — Ambulatory Visit (INDEPENDENT_AMBULATORY_CARE_PROVIDER_SITE_OTHER): Payer: No Typology Code available for payment source

## 2023-11-24 ENCOUNTER — Ambulatory Visit: Payer: No Typology Code available for payment source | Admitting: Family Medicine

## 2023-11-24 DIAGNOSIS — Z23 Encounter for immunization: Secondary | ICD-10-CM | POA: Diagnosis not present

## 2024-01-29 ENCOUNTER — Encounter: Payer: Self-pay | Admitting: Family Medicine

## 2024-02-05 ENCOUNTER — Other Ambulatory Visit: Payer: Self-pay

## 2024-02-05 DIAGNOSIS — Z0000 Encounter for general adult medical examination without abnormal findings: Secondary | ICD-10-CM

## 2024-02-19 ENCOUNTER — Other Ambulatory Visit: Payer: No Typology Code available for payment source

## 2024-02-19 DIAGNOSIS — Z0000 Encounter for general adult medical examination without abnormal findings: Secondary | ICD-10-CM

## 2024-02-20 ENCOUNTER — Encounter: Payer: Self-pay | Admitting: Family Medicine

## 2024-02-20 LAB — COMPREHENSIVE METABOLIC PANEL
ALT: 30 [IU]/L (ref 0–44)
AST: 60 [IU]/L — ABNORMAL HIGH (ref 0–40)
Albumin: 4.5 g/dL (ref 4.1–5.1)
Alkaline Phosphatase: 42 [IU]/L — ABNORMAL LOW (ref 44–121)
BUN/Creatinine Ratio: 7 — ABNORMAL LOW (ref 9–20)
BUN: 7 mg/dL (ref 6–24)
Bilirubin Total: 0.7 mg/dL (ref 0.0–1.2)
CO2: 23 mmol/L (ref 20–29)
Calcium: 9.3 mg/dL (ref 8.7–10.2)
Chloride: 100 mmol/L (ref 96–106)
Creatinine, Ser: 0.99 mg/dL (ref 0.76–1.27)
Globulin, Total: 1.9 g/dL (ref 1.5–4.5)
Glucose: 81 mg/dL (ref 70–99)
Potassium: 4.8 mmol/L (ref 3.5–5.2)
Sodium: 139 mmol/L (ref 134–144)
Total Protein: 6.4 g/dL (ref 6.0–8.5)
eGFR: 94 mL/min/{1.73_m2} (ref >59)

## 2024-02-20 LAB — CBC WITH DIFFERENTIAL/PLATELET
Basophils Absolute: 0.1 10*3/uL (ref 0.0–0.2)
Basos: 1 %
EOS (ABSOLUTE): 0.3 10*3/uL (ref 0.0–0.4)
Eos: 6 %
Hematocrit: 45.9 % (ref 37.5–51.0)
Hemoglobin: 15.3 g/dL (ref 13.0–17.7)
Immature Grans (Abs): 0 10*3/uL (ref 0.0–0.1)
Immature Granulocytes: 0 %
Lymphocytes Absolute: 1.8 10*3/uL (ref 0.7–3.1)
Lymphs: 39 %
MCH: 28.6 pg (ref 26.6–33.0)
MCHC: 33.3 g/dL (ref 31.5–35.7)
MCV: 86 fL (ref 79–97)
Monocytes Absolute: 0.5 10*3/uL (ref 0.1–0.9)
Monocytes: 10 %
Neutrophils Absolute: 2 10*3/uL (ref 1.4–7.0)
Neutrophils: 44 %
Platelets: 334 10*3/uL (ref 150–450)
RBC: 5.35 x10E6/uL (ref 4.14–5.80)
RDW: 12.5 % (ref 11.6–15.4)
WBC: 4.6 10*3/uL (ref 3.4–10.8)

## 2024-02-20 LAB — HEMOGLOBIN A1C
Est. average glucose Bld gHb Est-mCnc: 108 mg/dL
Hgb A1c MFr Bld: 5.4 % (ref 4.8–5.6)

## 2024-02-20 LAB — LIPID PANEL
Chol/HDL Ratio: 3.2 {ratio} (ref 0.0–5.0)
Cholesterol, Total: 183 mg/dL (ref 100–199)
HDL: 58 mg/dL (ref >39)
LDL Chol Calc (NIH): 111 mg/dL — ABNORMAL HIGH (ref 0–99)
Triglycerides: 76 mg/dL (ref 0–149)
VLDL Cholesterol Cal: 14 mg/dL (ref 5–40)

## 2024-02-20 LAB — TSH: TSH: 1.19 u[IU]/mL (ref 0.450–4.500)

## 2024-02-26 ENCOUNTER — Ambulatory Visit (INDEPENDENT_AMBULATORY_CARE_PROVIDER_SITE_OTHER): Payer: No Typology Code available for payment source | Admitting: Family Medicine

## 2024-02-26 ENCOUNTER — Encounter: Payer: Self-pay | Admitting: Family Medicine

## 2024-02-26 VITALS — BP 117/67 | HR 60 | Ht 71.0 in | Wt 199.5 lb

## 2024-02-26 DIAGNOSIS — M25561 Pain in right knee: Secondary | ICD-10-CM

## 2024-02-26 DIAGNOSIS — R351 Nocturia: Secondary | ICD-10-CM | POA: Diagnosis not present

## 2024-02-26 DIAGNOSIS — M545 Low back pain, unspecified: Secondary | ICD-10-CM | POA: Insufficient documentation

## 2024-02-26 DIAGNOSIS — Z0000 Encounter for general adult medical examination without abnormal findings: Secondary | ICD-10-CM | POA: Diagnosis not present

## 2024-02-26 DIAGNOSIS — M25562 Pain in left knee: Secondary | ICD-10-CM

## 2024-02-26 DIAGNOSIS — R1310 Dysphagia, unspecified: Secondary | ICD-10-CM | POA: Insufficient documentation

## 2024-02-26 DIAGNOSIS — G8929 Other chronic pain: Secondary | ICD-10-CM | POA: Insufficient documentation

## 2024-02-26 NOTE — Assessment & Plan Note (Signed)
 Discussed that this can be evaluated by sports medicine or orthopedics.  Patient will update primary care if he needs a referral sent to a different location after meeting with the sports medicine provider that he found.

## 2024-02-26 NOTE — Patient Instructions (Signed)
 Continue to monitor your symptoms. If you need to wake up to use the bathroom more than twice a night, please let us know. Otherwise, you can try cutting off fluid intake earlier in the evening.

## 2024-02-26 NOTE — Assessment & Plan Note (Signed)
 Symptoms concerning for possible esophageal stricture, referring to gastroenterology for further evaluation and likely endoscopy.  Encouraged to keep a symptom log in the meantime.

## 2024-02-26 NOTE — Assessment & Plan Note (Signed)
 Discussed that this can be evaluated with the sports medicine provider that he is planning to establish with, or PCP can place a referral to orthopedics.  Patient is going to start with sports medicine and let primary care know if he needs a referral anywhere.

## 2024-02-26 NOTE — Progress Notes (Signed)
 Complete physical exam  Patient: Omar Beard.   DOB: [birth year]   47 y.o. Male  MRN: [id number]  Subjective:    Chief Complaint  Patient presents with   Annual Exam    Omar Beard. is a 48 y.o. male who presents today for a complete physical exam. He reports consuming a general diet. He generally feels fairly well. He reports sleeping well. He does have a list of additional problems to discuss today.  First, he notes bilateral knee pain worse on the left for which he plans to be evaluated by sports medicine.  He had a bursa sac removed on his left knee several years ago due to MRSA infection and states that his knee has ached chronically ever since that time, but it seems to be worsening over the past several months on both sides.  He also notes intermittent lumbar pain that seems to shoot up his back with particular movements.  The movements are not predictable, one time was bending over to pick up a toilet paper roll off the floor, another time was twisting at his desk.  He denies numbness, tingling, weakness.  This has not been evaluated before.  He is also quite concerned with feeling food gets stuck in his throat.  It has been ongoing for several years but is becoming much more frequent.  He describes a feeling of food getting stuck in his esophagus around the level of his heart with solid food or even the supplements that he takes.  There are even times that he tries to drink water to "force it down" where the water cannot alleviate the sensation. Denies history of acid reflux, denies sensation of heartburn.  His final concern is that he gets up once a night to use the bathroom.  He states that he is typically drinking water until 9-9:30 PM, then usually gets up around 2 AM to use the bathroom.  He voids an about equal to the water consumed before bed, and he feels that he is able to fully empty his bladder.   Most recent fall risk assessment:    02/26/2024    8:35 AM  Fall Risk    Falls in the past year? 0  Number falls in past yr: 0  Injury with Fall? 0  Risk for fall due to : No Fall Risks  Follow up Falls evaluation completed     Most recent depression and anxiety screenings:    02/26/2024    8:35 AM 02/25/2023    9:24 AM  PHQ 2/9 Scores  PHQ - 2 Score 0 0  PHQ- 9 Score 0       02/26/2024    8:35 AM 01/25/2022    9:21 AM  GAD 7 : Generalized Anxiety Score  Nervous, Anxious, on Edge 0 0  Control/stop worrying 0 0  Worry too much - different things 0 0  Trouble relaxing 0 0  Restless 0 0  Easily annoyed or irritable 0 0  Afraid - awful might happen 0 0  Total GAD 7 Score 0 0  Anxiety Difficulty Not difficult at all Not difficult at all    Patient Active Problem List   Diagnosis Date Noted   Dysphagia 02/26/2024   Nocturia 02/26/2024   Chronic pain of both knees 02/26/2024   Lumbar pain 02/26/2024   Chronic sinusitis 04/14/2018   Deviated nasal septum 04/14/2018   Hypertrophy of nasal turbinates 04/14/2018    Past Surgical History:  Procedure Laterality Date  DEBRIDEMENT PREPATELLAR BURSA     VASECTOMY     VASECTOMY REVERSAL     WISDOM TOOTH EXTRACTION     Social History   Tobacco Use   Smoking status: Never   Smokeless tobacco: Never  Vaping Use   Vaping status: Never Used  Substance Use Topics   Alcohol use: Not Currently   Drug use: Never   Family History  Problem Relation Age of Onset   Colon cancer Neg Hx    Colon polyps Neg Hx    Esophageal cancer Neg Hx    Rectal cancer Neg Hx    Stomach cancer Neg Hx    No Known Allergies   Patient Care Team: Melida Quitter, PA as PCP - General (Family Medicine) Flo Shanks, MD as Consulting Physician (Otolaryngology)   Outpatient Medications Prior to Visit  Medication Sig   Black Elderberry 50 MG/5ML SYRP Take 15 mLs by mouth daily.   Multiple Vitamins-Minerals (MULTIVITAMIN WITH MINERALS) tablet Take 1 tablet by mouth daily.   SUPER B COMPLEX/C PO Take 1 tablet by mouth  daily.   Turmeric (QC TUMERIC COMPLEX PO) Take by mouth.   No facility-administered medications prior to visit.    Review of Systems  Constitutional:  Negative for chills, fever and malaise/fatigue.  HENT:  Negative for congestion and hearing loss.   Eyes:  Negative for blurred vision and double vision.  Respiratory:  Negative for cough and shortness of breath.   Cardiovascular:  Negative for chest pain, palpitations and leg swelling.  Gastrointestinal:  Negative for abdominal pain, constipation, diarrhea and heartburn.       Dysphagia  Genitourinary:  Negative for frequency and urgency.  Musculoskeletal:  Positive for back pain and joint pain. Negative for myalgias and neck pain.  Neurological:  Negative for headaches.  Endo/Heme/Allergies:  Negative for polydipsia.  Psychiatric/Behavioral:  Negative for depression. The patient is not nervous/anxious and does not have insomnia.       Objective:    BP 117/67   Pulse 60   Ht 5\' 11"  (1.803 m)   Wt 199 lb 8 oz (90.5 kg)   SpO2 100%   BMI 27.82 kg/m    Physical Exam Constitutional:      General: He is not in acute distress.    Appearance: Normal appearance.  HENT:     Head: Normocephalic and atraumatic.     Right Ear: Tympanic membrane, ear canal and external ear normal. There is no impacted cerumen.     Left Ear: Tympanic membrane, ear canal and external ear normal. There is no impacted cerumen.     Nose: Nose normal.     Mouth/Throat:     Mouth: Mucous membranes are moist.     Pharynx: Oropharynx is clear. No posterior oropharyngeal erythema.  Eyes:     Extraocular Movements: Extraocular movements intact.     Conjunctiva/sclera: Conjunctivae normal.     Pupils: Pupils are equal, round, and reactive to light.  Neck:     Thyroid: No thyroid mass, thyromegaly or thyroid tenderness.  Cardiovascular:     Rate and Rhythm: Normal rate and regular rhythm.     Heart sounds: Normal heart sounds. No murmur heard.    No  friction rub. No gallop.  Pulmonary:     Effort: Pulmonary effort is normal. No respiratory distress.     Breath sounds: Normal breath sounds. No stridor. No wheezing or rales.  Abdominal:     General: Bowel sounds are normal.  Palpations: Abdomen is soft. There is no mass.     Tenderness: There is no abdominal tenderness.  Musculoskeletal:        General: Normal range of motion.     Cervical back: Normal range of motion and neck supple.  Lymphadenopathy:     Cervical: No cervical adenopathy.  Skin:    General: Skin is warm and dry.  Neurological:     Mental Status: He is alert and oriented to person, place, and time.     Cranial Nerves: No cranial nerve deficit.     Motor: No weakness.     Deep Tendon Reflexes: Reflexes normal.  Psychiatric:        Mood and Affect: Mood normal.        Assessment & Plan:    Routine Health Maintenance and Physical Exam  Immunization History  Administered Date(s) Administered   Influenza, Seasonal, Injecte, Preservative Fre 11/24/2023   Influenza,inj,Quad PF,6+ Mos 10/22/2019, 01/23/2021, 01/25/2022, 02/25/2023   PFIZER(Purple Top)SARS-COV-2 Vaccination 08/26/2020, 10/10/2020   Tdap 04/02/2013, 02/25/2023    Health Maintenance  Topic Date Due   HIV Screening  Never done   Hepatitis C Screening  Never done   COVID-19 Vaccine (3 - 2024-25 season) 08/24/2023   Colonoscopy  05/15/2030   DTaP/Tdap/Td (3 - Td or Tdap) 02/24/2033   INFLUENZA VACCINE  Completed   HPV VACCINES  Aged Out    Reviewed most recent labs including CBC, CMP, lipid panel, A1C, TSH, and vitamin D. All within normal limits/stable from last check other than elevated AST of 60, LDL has improved to 111 compared to 129 a year ago.  Discussed health benefits of physical activity, and encouraged him to engage in regular exercise appropriate for his age and condition.  Wellness examination  Dysphagia, unspecified type Assessment & Plan: Symptoms concerning for possible  esophageal stricture, referring to gastroenterology for further evaluation and likely endoscopy.  Encouraged to keep a symptom log in the meantime.  Orders: -     Ambulatory referral to Gastroenterology  Nocturia Assessment & Plan: Discussed that voiding once in the middle of the night can be very normal but to monitor symptoms closely.  If the nocturia interrupts his sleep pattern, he should cut off fluid intake earlier in the evening.  If frequency of nocturia increases or symptoms develop like feeling of incomplete bladder emptying or weak stream, update PCP.   Chronic pain of both knees Assessment & Plan: Discussed that this can be evaluated by sports medicine or orthopedics.  Patient will update primary care if he needs a referral sent to a different location after meeting with the sports medicine provider that he found.   Lumbar pain Assessment & Plan: Discussed that this can be evaluated with the sports medicine provider that he is planning to establish with, or PCP can place a referral to orthopedics.  Patient is going to start with sports medicine and let primary care know if he needs a referral anywhere.     Return in about 1 year (around 02/25/2025) for annual physical, fasting labs 1 week before.     Melida Quitter, PA

## 2024-02-26 NOTE — Assessment & Plan Note (Signed)
 Discussed that voiding once in the middle of the night can be very normal but to monitor symptoms closely.  If the nocturia interrupts his sleep pattern, he should cut off fluid intake earlier in the evening.  If frequency of nocturia increases or symptoms develop like feeling of incomplete bladder emptying or weak stream, update PCP.

## 2024-03-24 ENCOUNTER — Telehealth: Payer: Self-pay | Admitting: *Deleted

## 2024-03-24 NOTE — Telephone Encounter (Signed)
 LVM for pt to call office.  It looks like the charge is from other things/concerns being discussed at this visit.

## 2024-03-24 NOTE — Telephone Encounter (Signed)
 Copied from CRM [id number]. Topic: Complaint (DO NOT CONVERT) - Billing/Coding >> Mar 23, 2024 10:16 AM Everette C wrote: DOS: 02/26/24 Details of complaint: The patient has received a charge of $25 for something that was not covered during their physical   The patient was told that the charge was for something additional that occurred during their physical but is uncertain of what could have occurred.  Please contact further when possible to discuss billing/coding discrepancies   How would the patient like to see this issue resolved? To be contacted and resolved amicably    Route to Research officer, political party.

## 2024-03-25 NOTE — Telephone Encounter (Signed)
 Patient called back. Returned call. Called CAL was told to provider the note from the chart. Provided note that charge is from other things/concerns being discussed at this visit. Patient states nothing else was done and would like more detailed information for the charge. Patient would like for someone to give him a call back. Thank You

## 2024-03-26 NOTE — Telephone Encounter (Signed)
 Contacted pt and informed him that at the visit he had some problems that were discussed in addition to his physical and that is where the additional charge came from.

## 2024-12-20 ENCOUNTER — Other Ambulatory Visit: Payer: Self-pay

## 2024-12-20 ENCOUNTER — Emergency Department (HOSPITAL_BASED_OUTPATIENT_CLINIC_OR_DEPARTMENT_OTHER): Admitting: Radiology

## 2024-12-20 ENCOUNTER — Emergency Department (HOSPITAL_BASED_OUTPATIENT_CLINIC_OR_DEPARTMENT_OTHER)
Admission: EM | Admit: 2024-12-20 | Discharge: 2024-12-21 | Disposition: A | Discharge status: Home / Self Care | Attending: Emergency Medicine | Admitting: Emergency Medicine

## 2024-12-20 ENCOUNTER — Encounter (HOSPITAL_BASED_OUTPATIENT_CLINIC_OR_DEPARTMENT_OTHER): Payer: Self-pay | Admitting: Emergency Medicine

## 2024-12-20 DIAGNOSIS — W540XXA Bitten by dog, initial encounter: Secondary | ICD-10-CM | POA: Diagnosis not present

## 2024-12-20 DIAGNOSIS — Z23 Encounter for immunization: Secondary | ICD-10-CM | POA: Diagnosis not present

## 2024-12-20 DIAGNOSIS — S80212A Abrasion, left knee, initial encounter: Secondary | ICD-10-CM | POA: Insufficient documentation

## 2024-12-20 DIAGNOSIS — S81811A Laceration without foreign body, right lower leg, initial encounter: Secondary | ICD-10-CM | POA: Diagnosis not present

## 2024-12-20 DIAGNOSIS — T148XXA Other injury of unspecified body region, initial encounter: Secondary | ICD-10-CM

## 2024-12-20 DIAGNOSIS — S8991XA Unspecified injury of right lower leg, initial encounter: Secondary | ICD-10-CM | POA: Diagnosis present

## 2024-12-20 MED ORDER — TETANUS-DIPHTH-ACELL PERTUSSIS 5-2-15.5 LF-MCG/0.5 IM SUSP
0.5000 mL | Freq: Once | INTRAMUSCULAR | Status: AC
  Administered 2024-12-20: 0.5 mL via INTRAMUSCULAR
  Filled 2024-12-20: qty 0.5

## 2024-12-20 MED ORDER — LIDOCAINE HCL (PF) 1 % IJ SOLN
10.0000 mL | Freq: Once | INTRAMUSCULAR | Status: AC
  Administered 2024-12-20: 10 mL via INTRADERMAL
  Filled 2024-12-20: qty 10

## 2024-12-20 NOTE — ED Provider Triage Note (Signed)
 Emergency Medicine Provider Triage Evaluation Note  Omar Beard. , a 48 y.o. male  was evaluated in triage.  Pt complains of dog bite. Unsure of last tetanus. Neighbor's dog - they think it has vaccines up to date.  Review of Systems  Positive:  Negative:   Physical Exam  BP (!) 132/97 (BP Location: Right Arm)   Pulse 83   Temp 97.8 F (36.6 C)   Resp 18   SpO2 100%  Gen:   Awake, no distress   Resp:  Normal effort  MSK:   Moves extremities without difficulty  Other:    Medical Decision Making  Medically screening exam initiated at 6:59 PM.  Appropriate orders placed.  Omar Beard. was informed that the remainder of the evaluation will be completed by another provider, this initial triage assessment does not replace that evaluation, and the importance of remaining in the ED until their evaluation is complete.     Omar Beard, [place] 12/20/24 1900

## 2024-12-20 NOTE — ED Triage Notes (Signed)
 Bit by neighbor's dog. Claims dog is vaccinated. Animal controlled called. Right calf bite. Fell down on right knee with abrasion.

## 2024-12-20 NOTE — ED Provider Notes (Signed)
 " [place] EMERGENCY DEPARTMENT AT [facility] Provider Note   CSN: [id number] Arrival date & time: 12/20/24  1818     Patient presents with: Animal Bite   Omar Beard. is a 48 y.o. male. Patient presents to the ED with concerns of an animal bite. He reports that he was bitten by his neighbors dog who is UTD on all immunizations. He states that he did also fall during this bite and scrapped his left knee but otherwise denies any other area of pain. He states that he is feeling well otherwise. Unsure last tdap.   Animal Bite      Prior to Admission medications  Medication Sig Start Date End Date Taking? Authorizing Provider  amoxicillin -clavulanate (AUGMENTIN ) 875-125 MG tablet Take 1 tablet by mouth every 12 (twelve) hours for 7 days. 12/21/24 12/28/24 Yes Mashell Sieben A, PA-C  Black Elderberry 50 MG/5ML SYRP Take 15 mLs by mouth daily.    [provider]  Multiple Vitamins-Minerals (MULTIVITAMIN WITH MINERALS) tablet Take 1 tablet by mouth daily.    [provider]  SUPER B COMPLEX/C PO Take 1 tablet by mouth daily.    [provider]  Turmeric (QC TUMERIC COMPLEX PO) Take by mouth.    [provider]    Allergies: Patient has no known allergies.    Review of Systems  Skin:  Positive for wound.  All other systems reviewed and are negative.   Updated Vital Signs BP 136/76   Pulse 71   Temp 97.8 F (36.6 C)   Resp 18   SpO2 100%   Physical Exam Vitals and nursing note reviewed.  Constitutional:      General: He is not in acute distress.    Appearance: He is well-developed.  HENT:     Head: Normocephalic and atraumatic.  Eyes:     Conjunctiva/sclera: Conjunctivae normal.  Cardiovascular:     Rate and Rhythm: Normal rate and regular rhythm.     Heart sounds: No murmur heard. Pulmonary:     Effort: Pulmonary effort is normal. No respiratory distress.     Breath sounds: Normal breath sounds.  Abdominal:      Palpations: Abdomen is soft.     Tenderness: There is no abdominal tenderness.  Musculoskeletal:        General: No swelling.     Cervical back: Neck supple.  Skin:    General: Skin is warm and dry.     Capillary Refill: Capillary refill takes less than 2 seconds.     Findings: Lesion present.     Comments: Multiple lesions to the right lower leg with a larger 4cm laceration to the right calf noted. Minimal bleeding present across all lesions. Neurovascularly and neuromuscularly intact.  Left knee with superficial abrasion with no active bleeding seen.  Neurological:     Mental Status: He is alert.  Psychiatric:        Mood and Affect: Mood normal.     (all labs ordered are listed, but only abnormal results are displayed) Labs Reviewed - No data to display  EKG: None  Radiology: DG Tibia/Fibula Right Result Date: 12/20/2024 CLINICAL DATA:  Dog bite. EXAM: RIGHT TIBIA AND FIBULA - 2 VIEW COMPARISON:  None Available. FINDINGS: There is no evidence of fracture or other focal bone lesions. Knee and ankle alignment are maintained. Soft tissue edema and gas in the medial calf. No radiopaque foreign body. IMPRESSION: Soft tissue edema and gas in the medial calf. No radiopaque  foreign body. No acute osseous abnormality. Electronically Signed   By: Andrea Gasman M.D.   On: 12/20/2024 20:30   DG Knee 2 Views Left Result Date: 12/20/2024 CLINICAL DATA:  Dog bite. EXAM: LEFT KNEE - 1-2 VIEW COMPARISON:  None Available. FINDINGS: No evidence of fracture, dislocation, or joint effusion. No evidence of arthropathy or other focal bone abnormality. No radiopaque foreign body. Soft tissue edema anteriorly. IMPRESSION: Soft tissue edema. No fracture or radiopaque foreign body. Electronically Signed   By: Andrea Gasman M.D.   On: 12/20/2024 20:29     .Laceration Repair  Date/Time: 12/21/2024 1:09 PM  Performed by: Azariyah Luhrs A, PA-C Authorized by: Shamara Soza A, PA-C   Consent:     Consent obtained:  Verbal   Consent given by:  Patient   Risks discussed:  Infection, poor cosmetic result and pain   Alternatives discussed:  No treatment Universal protocol:    Patient identity confirmed:  Arm band and verbally with patient Anesthesia:    Anesthesia method:  Local infiltration   Local anesthetic:  Lidocaine  1% w/o epi Laceration details:    Location:  Leg   Leg location:  R lower leg   Length (cm):  5   Depth (mm):  4 Pre-procedure details:    Preparation:  Imaging obtained to evaluate for foreign bodies Exploration:    Hemostasis achieved with:  Direct pressure   Imaging outcome: foreign body not noted   Treatment:    Area cleansed with:  Chlorhexidine and saline   Amount of cleaning:  Extensive   Irrigation solution:  Sterile saline   Irrigation volume:  1000cc   Irrigation method:  Syringe Skin repair:    Repair method:  Sutures   Suture size:  4-0   Suture material:  Nylon   Suture technique:  Simple interrupted   Number of sutures:  2 Approximation:    Approximation:  Loose Repair type:    Repair type:  Simple Post-procedure details:    Dressing:  Bulky dressing   Procedure completion:  Tolerated    Medications Ordered in the ED  Tdap (ADACEL ) injection 0.5 mL (0.5 mLs Intramuscular Given 12/20/24 2348)  lidocaine  (PF) (XYLOCAINE ) 1 % injection 10 mL (10 mLs Intradermal Given 12/20/24 2349)  amoxicillin -clavulanate (AUGMENTIN ) 875-125 MG per tablet 1 tablet (1 tablet Oral Given 12/21/24 0036)                                    Medical Decision Making Risk Prescription drug management.   This patient presents to the ED for concern of animal bite.  Differential diagnosis includes dog bite, rabies exposure, laceration, puncture injury    Additional history obtained:  Additional history obtained from chart review   Imaging Studies ordered:  I ordered imaging studies including x-ray right tibia/fibula, left knee I independently  visualized and interpreted imaging which showed no evidence of any acute fracture, dislocation, but there is some evidence of gas and soft tissue edema in the medial calf of the right leg I agree with the radiologist interpretation   Medicines ordered and prescription drug management:  I ordered medication including Tdap, lidocaine , Augmentin  for prophylaxis, anesthesia, animal bite Reevaluation of the patient after these medicines showed that the patient improved I have reviewed the patients home medicines and have made adjustments as needed   Problem List / ED Course:  Patient presents to the emergency department today  with concerns of a dog bite.  Reports that he was bit by his neighbors dog.  Dog is up-to-date on vaccinations as far as they are aware.  Animal control is already contacted and it appears that they were working to get things ordered from that end but there is low concern at this time for rabies exposure.  Unclear last Tdap. On exam, patient has multiple puncture marks to the right calf.  There is minimal bleeding at this time.  Patient is able to plantar and dorsiflex the right foot with some pain in the calf but neurovascularly neuromuscularly intact.  There is a large gaping wound to the medial calf with what appears to be exposed adipose and muscle tissue. Imaging obtained which is reassuring no signs of deeper injury or foreign body.  There is some callus and edema present to the medial calf likely from the punctures of the dog bite. Wound is thoroughly irrigated and cleansed.  Advised patient due to size of larger gaping wound to the medial calf that loose closure would be beneficial to reduce the risk of contamination and subsequent infection. Wound closed with 4-0 nylon.  Advised patient to come back for wound evaluation with some no signs of infection within the next week for suture removal.  Will start patient on Augmentin  and Tdap updated.  Strongly cautioned to monitor  wound for any signs of infection and return the emergency department if any signs infection were to develop.  He is otherwise stable for outpatient follow-up and discharged home.   Social Determinants of Health:  None  Final diagnoses:  Animal bite  Leg laceration, right, initial encounter    ED Discharge Orders          Ordered    amoxicillin -clavulanate (AUGMENTIN ) 875-125 MG tablet  Every 12 hours        12/21/24 0026               Benny Henrie A, PA-C 12/21/24 1312    Pamella Ozell LABOR, DO 12/27/24 [id number]  "

## 2024-12-21 MED ORDER — AMOXICILLIN-POT CLAVULANATE 875-125 MG PO TABS
1.0000 | ORAL_TABLET | Freq: Once | ORAL | Status: AC
  Administered 2024-12-21: 1 via ORAL
  Filled 2024-12-21: qty 1

## 2024-12-21 MED ORDER — AMOXICILLIN-POT CLAVULANATE 875-125 MG PO TABS: 1.0000 | ORAL_TABLET | Freq: Two times a day (BID) | ORAL | 0 refills | Status: AC

## 2024-12-21 NOTE — Discharge Instructions (Signed)
 You were seen in the ER today for concerns of a dog bite. The wounds were evaluated, cleaned, and one large wound had two stitches placed to help prevent infection from a large gaping wound. You had Tdap updated and were started on antibiotics. Please make sure you are taking your antibiotics as prescribed. For any concerns of infection, please seek evaluation. The stitches will remain in place for the next 7 days or so before they should be removed either in the ER, with your primary care provider, or at an urgent care.

## 2024-12-22 ENCOUNTER — Ambulatory Visit
Admission: RE | Admit: 2024-12-22 | Discharge: 2024-12-22 | Disposition: A | Discharge status: Home / Self Care | Source: Ambulatory Visit

## 2024-12-22 ENCOUNTER — Ambulatory Visit: Payer: Self-pay

## 2024-12-22 NOTE — ED Notes (Signed)
 Pt is going to leave before being seen by provider. As provider stated, medical advice at this time would be to wait since dog is being quarantined by animal control. Wound has already been closed with sutures be ED (12/29). Treated with augmentin  and updated tetanus. Relayed information from provider and pt had option to start rabies series if desired. Chose to wait through quarantine period of dog and return to urgent care if necessary. Available urgent care is available 8-8 M-F and 8-4 on weekends if needed in the future. Verbalized understanding. Left without being seen by provider.

## 2024-12-22 NOTE — Telephone Encounter (Signed)
 Agree with RN disposition. ED will administer rabies vaccination if they see fit. No further recommendations at this time.

## 2024-12-22 NOTE — Telephone Encounter (Signed)
" °  Reason for Disposition  Health information question, no triage required and triager able to answer question  Answer Assessment - Initial Assessment Questions Patient was seen in ED post dog bite. At time of ED visit, he believed dog was UTD on vaccinations. He has since determined that the dog is overdue for rabies.   This RN advised him to call [facility] ED to determine if he needs to go in via the ED or if they are able to order the vaccine if the ED provider recommends without having to go through the ED. Provided patient with phone number.  Patient to call our office if he develops any signs of infection prior to his hosp f/u visit.  Protocols used: Information Only Call - No Triage-A-AH Copied from CRM [id number]. Topic: Clinical - Medical Advice >> Dec 22, 2024 11:14 AM Darshell M wrote: Reason for CRM: Patient bitten by dog and treated at ER 12/20/2024. Today patient found out dog's vaccines expired in October. Dog is being quarantined for 10 days. Patient was wondering if he should start rabies regiment. Scheduled a hospital follow up for 12/30/2024 to have sutures removed. Patient CB# [phone] "

## 2024-12-22 NOTE — ED Triage Notes (Signed)
 Pt was seen at ED on 12/29 for dog bite - started on Augmentin , updated Tdap, and had sutures placed in wound (R calf bite). No rabies treatment was initiated at the time due to believing dog was UTD on vaccines. Animal control informed him the rabies vaccine expired in Oct 2025. Dog is now in quarantine. They could not advise him for treatment and pt was unsure if he should start rabies vaccine course.

## 2024-12-24 ENCOUNTER — Ambulatory Visit
Admission: RE | Admit: 2024-12-24 | Discharge: 2024-12-24 | Disposition: A | Discharge status: Home / Self Care | Source: Ambulatory Visit | Attending: Family Medicine | Admitting: Family Medicine

## 2024-12-24 VITALS — BP 143/87 | HR 72 | Temp 98.4°F | Resp 18

## 2024-12-24 DIAGNOSIS — W540XXA Bitten by dog, initial encounter: Secondary | ICD-10-CM | POA: Diagnosis not present

## 2024-12-24 DIAGNOSIS — M25471 Effusion, right ankle: Secondary | ICD-10-CM

## 2024-12-24 NOTE — ED Provider Notes (Signed)
 " [facility]    CSN: [id number] Arrival date & time: 12/24/24  1758      History   Chief Complaint Chief Complaint  Patient presents with   Puncture Wound   Animal Bite    HPI Omar Beard. is a 49 y.o. male.    Animal Bite  Here for concern about swelling in his right ankle. On December 29, he was bitten by a neighbors aggressive dog in the right calf.  He was seen in the ER.  An animal bite report was made and the dog is now being quarantined.  He has had some swelling develop in his ankle today.  No fever noted but he has felt maybe tired and may be dizzy.  He is taking Augmentin   He has had several different sources tell him that he might need to have the rabies series since the dog that bit him's vaccination was expired but just in 2025.  NKDA   Past Medical History:  Diagnosis Date   Allergy     Patient Active Problem List   Diagnosis Date Noted   Dysphagia 02/26/2024   Nocturia 02/26/2024   Chronic pain of both knees 02/26/2024   Lumbar pain 02/26/2024   Chronic sinusitis 04/14/2018   Deviated nasal septum 04/14/2018   Hypertrophy of nasal turbinates 04/14/2018    Past Surgical History:  Procedure Laterality Date   DEBRIDEMENT PREPATELLAR BURSA     VASECTOMY     VASECTOMY REVERSAL     WISDOM TOOTH EXTRACTION         Home Medications    Prior to Admission medications  Medication Sig Start Date End Date Taking? Authorizing Provider  amoxicillin -clavulanate (AUGMENTIN ) 875-125 MG tablet Take 1 tablet by mouth every 12 (twelve) hours for 7 days. 12/21/24 12/28/24 Yes Zelaya, Oscar A, PA-C  Black Elderberry 50 MG/5ML SYRP Take 15 mLs by mouth daily.    [provider]  Multiple Vitamins-Minerals (MULTIVITAMIN WITH MINERALS) tablet Take 1 tablet by mouth daily.    [provider]  SUPER B COMPLEX/C PO Take 1 tablet by mouth daily.    [provider]  Turmeric (QC TUMERIC COMPLEX PO) Take by mouth.    [provider]    Family History Family History  Problem Relation Age of Onset   Colon cancer Neg Hx    Colon polyps Neg Hx    Esophageal cancer Neg Hx    Rectal cancer Neg Hx    Stomach cancer Neg Hx     Social History Social History[1]   Allergies   Patient has no known allergies.   Review of Systems Review of Systems   Physical Exam Triage Vital Signs ED Triage Vitals  Encounter Vitals Group     BP 12/24/24 1825 (!) 143/87     Girls Systolic BP Percentile --      Girls Diastolic BP Percentile --      Boys Systolic BP Percentile --      Boys Diastolic BP Percentile --      Pulse Rate 12/24/24 1825 72     Resp 12/24/24 1825 18     Temp 12/24/24 1825 98.4 F (36.9 C)     Temp Source 12/24/24 1825 Oral     SpO2 12/24/24 1825 99 %     Weight --      Height --      Head Circumference --      Peak Flow --  Pain Score 12/24/24 1810 4     Pain Loc --      Pain Education --      Exclude from Growth Chart --    No data found.  Updated Vital Signs BP (!) 143/87 (BP Location: Left Arm)   Pulse 72   Temp 98.4 F (36.9 C) (Oral)   Resp 18   SpO2 99%   Visual Acuity Right Eye Distance:   Left Eye Distance:   Bilateral Distance:    Right Eye Near:   Left Eye Near:    Bilateral Near:     Physical Exam Vitals reviewed.  Constitutional:      General: He is not in acute distress.    Appearance: He is not ill-appearing, toxic-appearing or diaphoretic.  Cardiovascular:     Pulses: Normal pulses.  Musculoskeletal:     Comments: There is trace edema in the right ankle.  No erythema.  Currently it is normal temperature.  The patient states it felt warm earlier.    Skin:    Coloration: Skin is not jaundiced or pale.     Comments: The bite wounds are healing well.  There was 1 wound that was sutured with 2 sutures on the medial calf.  It also looks good and is without any signs of infection.  There is a little yellow ecchymosis around all those wounds.  No  erythema or warmth around the wounds and no drainage from the wound.    Neurological:     General: No focal deficit present.     Mental Status: He is alert and oriented to person, place, and time.  Psychiatric:        Behavior: Behavior normal.      UC Treatments / Results  Labs (all labs ordered are listed, but only abnormal results are displayed) Labs Reviewed - No data to display  EKG   Radiology No results found.  Procedures Procedures (including critical care time)  Medications Ordered in UC Medications - No data to display  Initial Impression / Assessment and Plan / UC Course  I have reviewed the triage vital signs and the nursing notes.  Pertinent labs & imaging results that were available during my care of the patient were reviewed by me and considered in my medical decision making (see chart for details).     I do think the ankle swelling is most likely from edema from the healing of the wounds proximal to it.  I do not see signs of a wound infection at this time, so I think he should just finish his Augmentin . We discussed the observation.  And that since the dog is quarantined that bit him, he should be safe to wait.  Animal control will let him know if the dog starts showing signs of any illness.  Reassurance provided  Final Clinical Impressions(s) / UC Diagnoses   Final diagnoses:  Right ankle swelling  Dog bite, initial encounter     Discharge Instructions      Finish taking your Augmentin  as prescribed.  Animal control should contact you if there is any sign of illness and the dog that is quarantined under observation.    ED Prescriptions   None    PDMP not reviewed this encounter.    [1]  Social History Tobacco Use   Smoking status: Never   Smokeless tobacco: Never  Vaping Use   Vaping status: Never Used  Substance Use Topics   Alcohol use: Not Currently   Drug  use: Never     Vonna Sharlet POUR, MD 12/24/24 2035  "

## 2024-12-24 NOTE — ED Triage Notes (Signed)
 I was bit by a dog Monday (12/29) and was seen at [facility].  An lexicographer informed me 12/31 that the dog's rabies vaccine was expired.  So I believe that I need to start the rabies PEP series. - Entered by patient  States he has been taking augmentin  prescribed for dog bite and today he noticed his leg and ankle were swollen

## 2024-12-24 NOTE — Discharge Instructions (Signed)
 Finish taking your Augmentin  as prescribed.  Animal control should contact you if there is any sign of illness and the dog that is quarantined under observation.

## 2024-12-30 ENCOUNTER — Encounter: Payer: Self-pay | Admitting: Family Medicine

## 2024-12-30 ENCOUNTER — Ambulatory Visit: Admitting: Family Medicine

## 2024-12-30 VITALS — BP 112/74 | HR 77 | Ht 71.0 in | Wt 203.1 lb

## 2024-12-30 DIAGNOSIS — W540XXD Bitten by dog, subsequent encounter: Secondary | ICD-10-CM | POA: Diagnosis not present

## 2024-12-30 DIAGNOSIS — S8002XA Contusion of left knee, initial encounter: Secondary | ICD-10-CM | POA: Insufficient documentation

## 2024-12-30 DIAGNOSIS — W540XXA Bitten by dog, initial encounter: Secondary | ICD-10-CM | POA: Insufficient documentation

## 2024-12-30 NOTE — Assessment & Plan Note (Signed)
 2 sutures removed today.  See media tab for pictures of bite wounds. Wound healing well with decreased swelling. Occasional sharp pain likely due to nerve irritation. - Continue daily dressing changes with Vaseline, cover with 4x4 gauze if oozing. - Follow up with pcp in 1-2 months if symptoms persist.

## 2024-12-30 NOTE — Patient Instructions (Signed)
" °  VISIT SUMMARY: Today, you were seen for a dog bite on your leg and a fall-related injury to your left knee. The dog bite is healing well, and your knee flexibility has improved since the fall.  YOUR PLAN: DOG BITE LACERATION OF RIGHT LOWER LEG: You have a healing wound on your right lower leg from a dog bite, with some occasional sharp pain likely due to nerve irritation. -Continue changing the dressing daily with Vaseline and cover with 4x4 gauze if it is oozing. -Use silver gel to help with healing. -Follow up with Saddie in 1-2 months if symptoms persist.  LEFT KNEE CONTUSION: You have a bruise on your left knee from a fall, with improved flexibility and occasional sharp pain.  -Follow up with Saddie if needed. -Schedule an earlier appointment if symptoms worsen or if your insurance requires an assessment.    "

## 2024-12-30 NOTE — Progress Notes (Signed)
" ° °  Established Patient Office Visit  Subjective   Patient ID: Omar Beard., male    DOB: 1976-05-18  Age: 49 y.o. MRN: [id number]  Chief Complaint  Patient presents with   Hospitalization Follow-up    History of Present Illness   Jamareon Shimel is a 49 year old male who presents with a dog bite on his leg.  On January 29th, he was bitten by a neighbor's dog, which was not current on its rabies vaccinations. The bite resulted in a tear and a puncture wound on his leg. He received two sutures and was prescribed Augmentin , which he completed recently. He is awaiting the quarantine period to end before considering rabies vaccination, as the dog's vaccination had expired in October.  He reports swelling in the lower part of his leg, which has decreased, but a bruise appeared on January 5th. There has been no reinjury or twisting of the leg. He is able to walk without significant pain, although there is occasional sharp pain near the puncture site.  During the incident, he fell on his left knee. 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 Since the fall, he has experienced improvement in knee flexibility and reports no current pain. An x-ray showed no fractures.  He has been changing the dressing on the wound daily, applying silver gel to aid healing. The wound continues to ooze slightly, and he uses Vaseline and covers it with a dressing to prevent staining clothes.          The 10-year ASCVD risk score (Arnett DK, et al., 2019) is: 1.8%  Health Maintenance Due  Topic Date Due   HIV Screening  Never done   Hepatitis C Screening  Never done   Hepatitis B Vaccines 19-59 Average Risk (1 of 3 - 19+ 3-dose series) Never done   COVID-19 Vaccine (3 - 2025-26 season) 08/23/2024      Objective:     BP 112/74   Pulse 77   Ht 5' 11 (1.803 m)   Wt 203 lb 1.9 oz (92.1 kg)   SpO2 98%    BMI 28.33 kg/m    Physical Exam   SKIN: Skin healing well, no signs of infection.        No results found for any visits on 12/30/24.      Assessment & Plan:   Dog bite, subsequent encounter Assessment & Plan: 2 sutures removed today.  See media tab for pictures of bite wounds. Wound healing well with decreased swelling. Occasional sharp pain likely due to nerve irritation. - Continue daily dressing changes with Vaseline, cover with 4x4 gauze if oozing. - Follow up with pcp in 1-2 months if symptoms persist.   Contusion of left knee, initial encounter Assessment & Plan: Improved flexibility and pain. Occasional sharp pain noted. Previous MRSA infection may contribute to discomfort. - Monitor for signs of instability, buckling, or locking. - Follow up with [facility] if needed. - Schedule earlier appointment if symptoms worsen or insurance requires assessment.        Return if symptoms worsen or fail to improve.    Toribio MARLA Slain, MD  "

## 2024-12-30 NOTE — Assessment & Plan Note (Signed)
 Improved flexibility and pain. Occasional sharp pain noted. Previous MRSA infection may contribute to discomfort. - Monitor for signs of instability, buckling, or locking. - Follow up with [facility] if needed. - Schedule earlier appointment if symptoms worsen or insurance requires assessment.

## 2025-02-03 ENCOUNTER — Ambulatory Visit

## 2025-02-18 ENCOUNTER — Other Ambulatory Visit

## 2025-02-25 ENCOUNTER — Encounter
